# Patient Record
Sex: Male | Born: 1980
Health system: Southern US, Community
[De-identification: ages and names within clinical notes are randomized; demographics above are authoritative.]

## PROBLEM LIST (undated history)

## (undated) DIAGNOSIS — A64 Unspecified sexually transmitted disease: Secondary | ICD-10-CM

## (undated) DIAGNOSIS — F419 Anxiety disorder, unspecified: Secondary | ICD-10-CM

## (undated) DIAGNOSIS — J309 Allergic rhinitis, unspecified: Secondary | ICD-10-CM

## (undated) HISTORY — DX: Anxiety disorder, unspecified: F41.9

## (undated) HISTORY — DX: Unspecified sexually transmitted disease: A64

## (undated) HISTORY — DX: Allergic rhinitis, unspecified: J30.9

## (undated) HISTORY — PX: FOOT SURGERY: SHX648

---
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
## 2004-02-17 ENCOUNTER — Emergency Department (HOSPITAL_COMMUNITY): Admission: EM | Admit: 2004-02-17 | Discharge: 2004-02-17 | Payer: Self-pay | Admitting: Emergency Medicine

## 2004-03-12 ENCOUNTER — Emergency Department (HOSPITAL_COMMUNITY): Admission: EM | Admit: 2004-03-12 | Discharge: 2004-03-13 | Payer: Self-pay | Admitting: Emergency Medicine

## 2006-10-06 ENCOUNTER — Ambulatory Visit: Payer: Self-pay | Admitting: Internal Medicine

## 2006-11-14 ENCOUNTER — Ambulatory Visit: Payer: Self-pay | Admitting: Internal Medicine

## 2006-11-14 LAB — CONVERTED CEMR LAB
Bilirubin Urine: NEGATIVE
Chlamydia, DNA Probe: NEGATIVE
GC Probe Amp, Genital: NEGATIVE
Hemoglobin, Urine: NEGATIVE
Ketones, ur: NEGATIVE mg/dL
Leukocytes, UA: NEGATIVE
Nitrite: NEGATIVE
Protein, ur: NEGATIVE mg/dL
RBC / HPF: NONE SEEN (ref <3)
Specific Gravity, Urine: 1.023 (ref 1.005–1.03)
Urine Glucose: NEGATIVE mg/dL
Urobilinogen, UA: 1 (ref 0.0–1.0)
pH: 6 (ref 5.0–8.0)

## 2007-05-20 ENCOUNTER — Ambulatory Visit: Payer: Self-pay | Admitting: Family Medicine

## 2007-05-20 ENCOUNTER — Encounter: Payer: Self-pay | Admitting: Internal Medicine

## 2007-12-22 ENCOUNTER — Ambulatory Visit: Payer: Self-pay | Admitting: Internal Medicine

## 2007-12-22 DIAGNOSIS — R300 Dysuria: Secondary | ICD-10-CM

## 2007-12-22 LAB — CONVERTED CEMR LAB
Bilirubin Urine: NEGATIVE
Blood in Urine, dipstick: NEGATIVE
Glucose, Urine, Semiquant: NEGATIVE
Ketones, urine, test strip: NEGATIVE
Nitrite: NEGATIVE
Protein, U semiquant: NEGATIVE
Specific Gravity, Urine: 1.01
Urobilinogen, UA: 0.2
WBC Urine, dipstick: NEGATIVE
pH: 5

## 2007-12-25 LAB — CONVERTED CEMR LAB

## 2008-01-16 ENCOUNTER — Ambulatory Visit: Payer: Self-pay | Admitting: Internal Medicine

## 2008-01-16 ENCOUNTER — Telehealth (INDEPENDENT_AMBULATORY_CARE_PROVIDER_SITE_OTHER): Payer: Self-pay | Admitting: *Deleted

## 2008-01-17 ENCOUNTER — Telehealth (INDEPENDENT_AMBULATORY_CARE_PROVIDER_SITE_OTHER): Payer: Self-pay | Admitting: *Deleted

## 2008-01-18 ENCOUNTER — Telehealth (INDEPENDENT_AMBULATORY_CARE_PROVIDER_SITE_OTHER): Payer: Self-pay | Admitting: *Deleted

## 2008-01-18 LAB — CONVERTED CEMR LAB
Chlamydia, DNA Probe: NEGATIVE
Chlamydia, Swab/Urine, PCR: NEGATIVE
GC Probe Amp, Genital: POSITIVE — AB
GC Probe Amp, Urine: POSITIVE — AB

## 2008-02-02 ENCOUNTER — Encounter: Payer: Self-pay | Admitting: Internal Medicine

## 2008-03-01 ENCOUNTER — Telehealth (INDEPENDENT_AMBULATORY_CARE_PROVIDER_SITE_OTHER): Payer: Self-pay | Admitting: *Deleted

## 2008-03-15 ENCOUNTER — Encounter: Payer: Self-pay | Admitting: Internal Medicine

## 2008-04-08 ENCOUNTER — Telehealth (INDEPENDENT_AMBULATORY_CARE_PROVIDER_SITE_OTHER): Payer: Self-pay | Admitting: *Deleted

## 2008-04-09 ENCOUNTER — Ambulatory Visit: Payer: Self-pay | Admitting: Internal Medicine

## 2008-04-09 LAB — CONVERTED CEMR LAB

## 2008-04-15 LAB — CONVERTED CEMR LAB
ALT: 26 units/L (ref 0–53)
AST: 24 units/L (ref 0–37)
BUN: 12 mg/dL (ref 6–23)
Basophils Absolute: 0 10*3/uL (ref 0.0–0.1)
Basophils Relative: 0.2 % (ref 0.0–3.0)
CO2: 29 meq/L (ref 19–32)
Calcium: 9.2 mg/dL (ref 8.4–10.5)
Chloride: 107 meq/L (ref 96–112)
Cholesterol: 185 mg/dL (ref 0–200)
Creatinine, Ser: 1 mg/dL (ref 0.4–1.5)
Eosinophils Absolute: 0.1 10*3/uL (ref 0.0–0.7)
Eosinophils Relative: 3.7 % (ref 0.0–5.0)
GFR calc Af Amer: 114 mL/min
GFR calc non Af Amer: 95 mL/min
Glucose, Bld: 83 mg/dL (ref 70–99)
HCT: 44 % (ref 39.0–52.0)
HDL: 32.8 mg/dL — ABNORMAL LOW (ref >39.0)
Hemoglobin: 14.9 g/dL (ref 13.0–17.0)
LDL Cholesterol: 132 mg/dL — ABNORMAL HIGH (ref 0–99)
Lymphocytes Relative: 50.5 % — ABNORMAL HIGH (ref 12.0–46.0)
MCHC: 33.9 g/dL (ref 30.0–36.0)
MCV: 92.9 fL (ref 78.0–100.0)
Monocytes Absolute: 0.3 10*3/uL (ref 0.1–1.0)
Monocytes Relative: 9 % (ref 3.0–12.0)
Neutro Abs: 1.4 10*3/uL (ref 1.4–7.7)
Neutrophils Relative %: 36.6 % — ABNORMAL LOW (ref 43.0–77.0)
Platelets: 234 10*3/uL (ref 150–400)
Potassium: 4.1 meq/L (ref 3.5–5.1)
RBC: 4.74 M/uL (ref 4.22–5.81)
RDW: 12.4 % (ref 11.5–14.6)
Sodium: 140 meq/L (ref 135–145)
TSH: 0.85 microintl units/mL (ref 0.35–5.50)
Total CHOL/HDL Ratio: 5.6
Triglycerides: 103 mg/dL (ref 0–149)
VLDL: 21 mg/dL (ref 0–40)
WBC: 3.7 10*3/uL — ABNORMAL LOW (ref 4.5–10.5)

## 2008-05-07 ENCOUNTER — Telehealth (INDEPENDENT_AMBULATORY_CARE_PROVIDER_SITE_OTHER): Payer: Self-pay | Admitting: *Deleted

## 2008-09-16 ENCOUNTER — Ambulatory Visit: Payer: Self-pay | Admitting: Family Medicine

## 2008-09-16 DIAGNOSIS — J309 Allergic rhinitis, unspecified: Secondary | ICD-10-CM

## 2008-09-16 HISTORY — DX: Allergic rhinitis, unspecified: J30.9

## 2008-09-16 LAB — CONVERTED CEMR LAB: Rapid Strep: POSITIVE

## 2008-09-23 ENCOUNTER — Encounter (INDEPENDENT_AMBULATORY_CARE_PROVIDER_SITE_OTHER): Payer: Self-pay | Admitting: *Deleted

## 2008-09-23 ENCOUNTER — Ambulatory Visit: Payer: Self-pay | Admitting: Internal Medicine

## 2008-09-26 ENCOUNTER — Telehealth: Payer: Self-pay | Admitting: Internal Medicine

## 2009-03-19 ENCOUNTER — Ambulatory Visit: Payer: Self-pay | Admitting: Family Medicine

## 2009-03-19 LAB — CONVERTED CEMR LAB
Bilirubin Urine: NEGATIVE
Glucose, Urine, Semiquant: NEGATIVE

## 2009-03-20 ENCOUNTER — Encounter: Payer: Self-pay | Admitting: Family Medicine

## 2009-03-20 ENCOUNTER — Telehealth (INDEPENDENT_AMBULATORY_CARE_PROVIDER_SITE_OTHER): Payer: Self-pay | Admitting: *Deleted

## 2009-03-20 LAB — CONVERTED CEMR LAB: GC Probe Amp, Urine: POSITIVE — AB

## 2009-03-31 ENCOUNTER — Telehealth (INDEPENDENT_AMBULATORY_CARE_PROVIDER_SITE_OTHER): Payer: Self-pay | Admitting: *Deleted

## 2009-04-03 ENCOUNTER — Ambulatory Visit: Payer: Self-pay | Admitting: Internal Medicine

## 2009-04-29 ENCOUNTER — Ambulatory Visit: Payer: Self-pay | Admitting: Internal Medicine

## 2009-04-29 ENCOUNTER — Telehealth (INDEPENDENT_AMBULATORY_CARE_PROVIDER_SITE_OTHER): Payer: Self-pay | Admitting: *Deleted

## 2009-04-29 LAB — CONVERTED CEMR LAB
Bilirubin Urine: NEGATIVE
Glucose, Bld: 109 mg/dL
Glucose, Urine, Semiquant: NEGATIVE
Specific Gravity, Urine: 1.01
pH: 6.5

## 2009-04-30 ENCOUNTER — Ambulatory Visit: Payer: Self-pay | Admitting: Internal Medicine

## 2009-04-30 ENCOUNTER — Telehealth (INDEPENDENT_AMBULATORY_CARE_PROVIDER_SITE_OTHER): Payer: Self-pay | Admitting: *Deleted

## 2009-05-06 ENCOUNTER — Encounter: Payer: Self-pay | Admitting: Internal Medicine

## 2009-05-31 ENCOUNTER — Emergency Department (HOSPITAL_COMMUNITY): Admission: EM | Admit: 2009-05-31 | Discharge: 2009-05-31 | Payer: Self-pay | Admitting: Emergency Medicine

## 2009-06-25 ENCOUNTER — Ambulatory Visit: Payer: Self-pay | Admitting: Internal Medicine

## 2009-06-27 LAB — CONVERTED CEMR LAB
ALT: 38 units/L (ref 0–53)
AST: 53 units/L — ABNORMAL HIGH (ref 0–37)
Basophils Relative: 0.2 % (ref 0.0–3.0)
CO2: 29 meq/L (ref 19–32)
Calcium: 9.7 mg/dL (ref 8.4–10.5)
Creatinine, Ser: 1.1 mg/dL (ref 0.4–1.5)
Eosinophils Absolute: 0.1 10*3/uL (ref 0.0–0.7)
HDL: 35.8 mg/dL — ABNORMAL LOW (ref >39.00)
Lymphocytes Relative: 51.2 % — ABNORMAL HIGH (ref 12.0–46.0)
MCHC: 34.3 g/dL (ref 30.0–36.0)
Neutrophils Relative %: 35.2 % — ABNORMAL LOW (ref 43.0–77.0)
RBC: 4.82 M/uL (ref 4.22–5.81)
Total CHOL/HDL Ratio: 5
Triglycerides: 59 mg/dL (ref 0.0–149.0)
WBC: 3.4 10*3/uL — ABNORMAL LOW (ref 4.5–10.5)

## 2009-07-04 IMAGING — CR DG CHEST 2V
2 series · 2 of 2 positions shown · non-contrast
Comparison: None

CLINICAL DATA: Fever.  Chest congestion.  Shortness of breath.

CHEST - 2 VIEW

[view not recorded (1 of 2)]
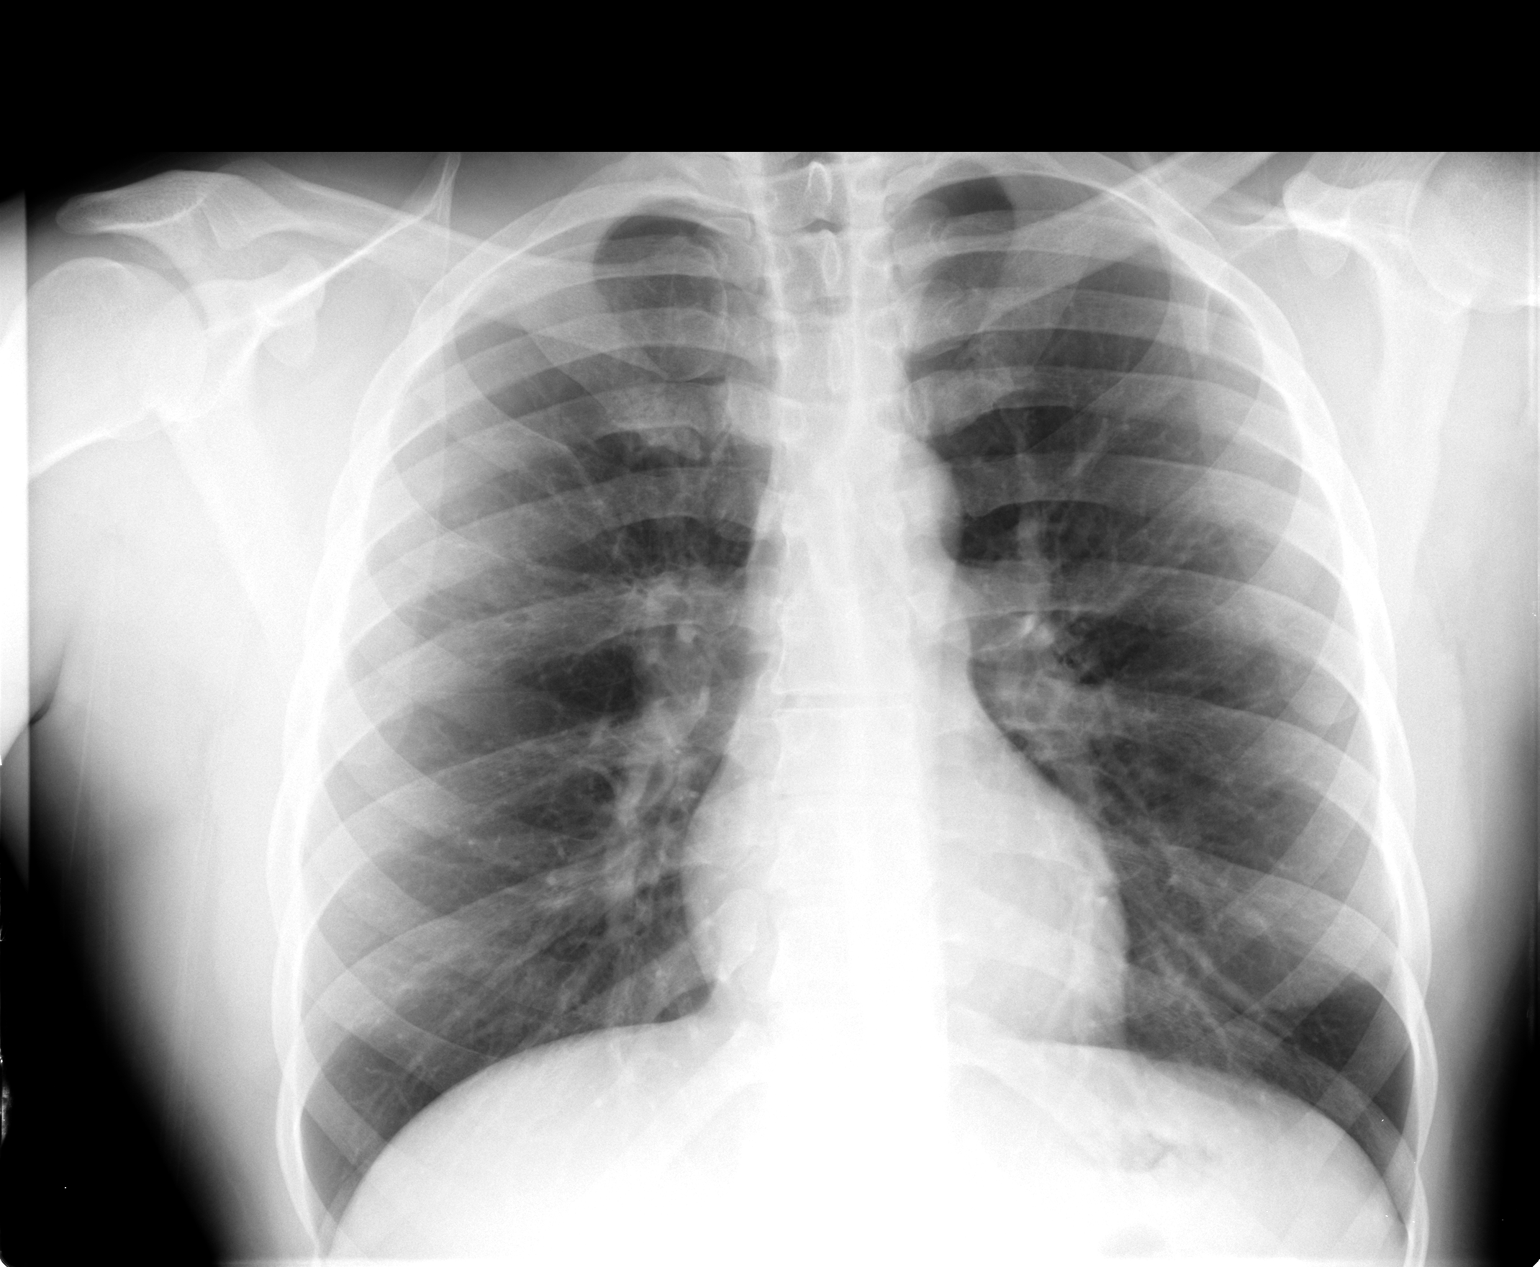

[view not recorded (2 of 2)]
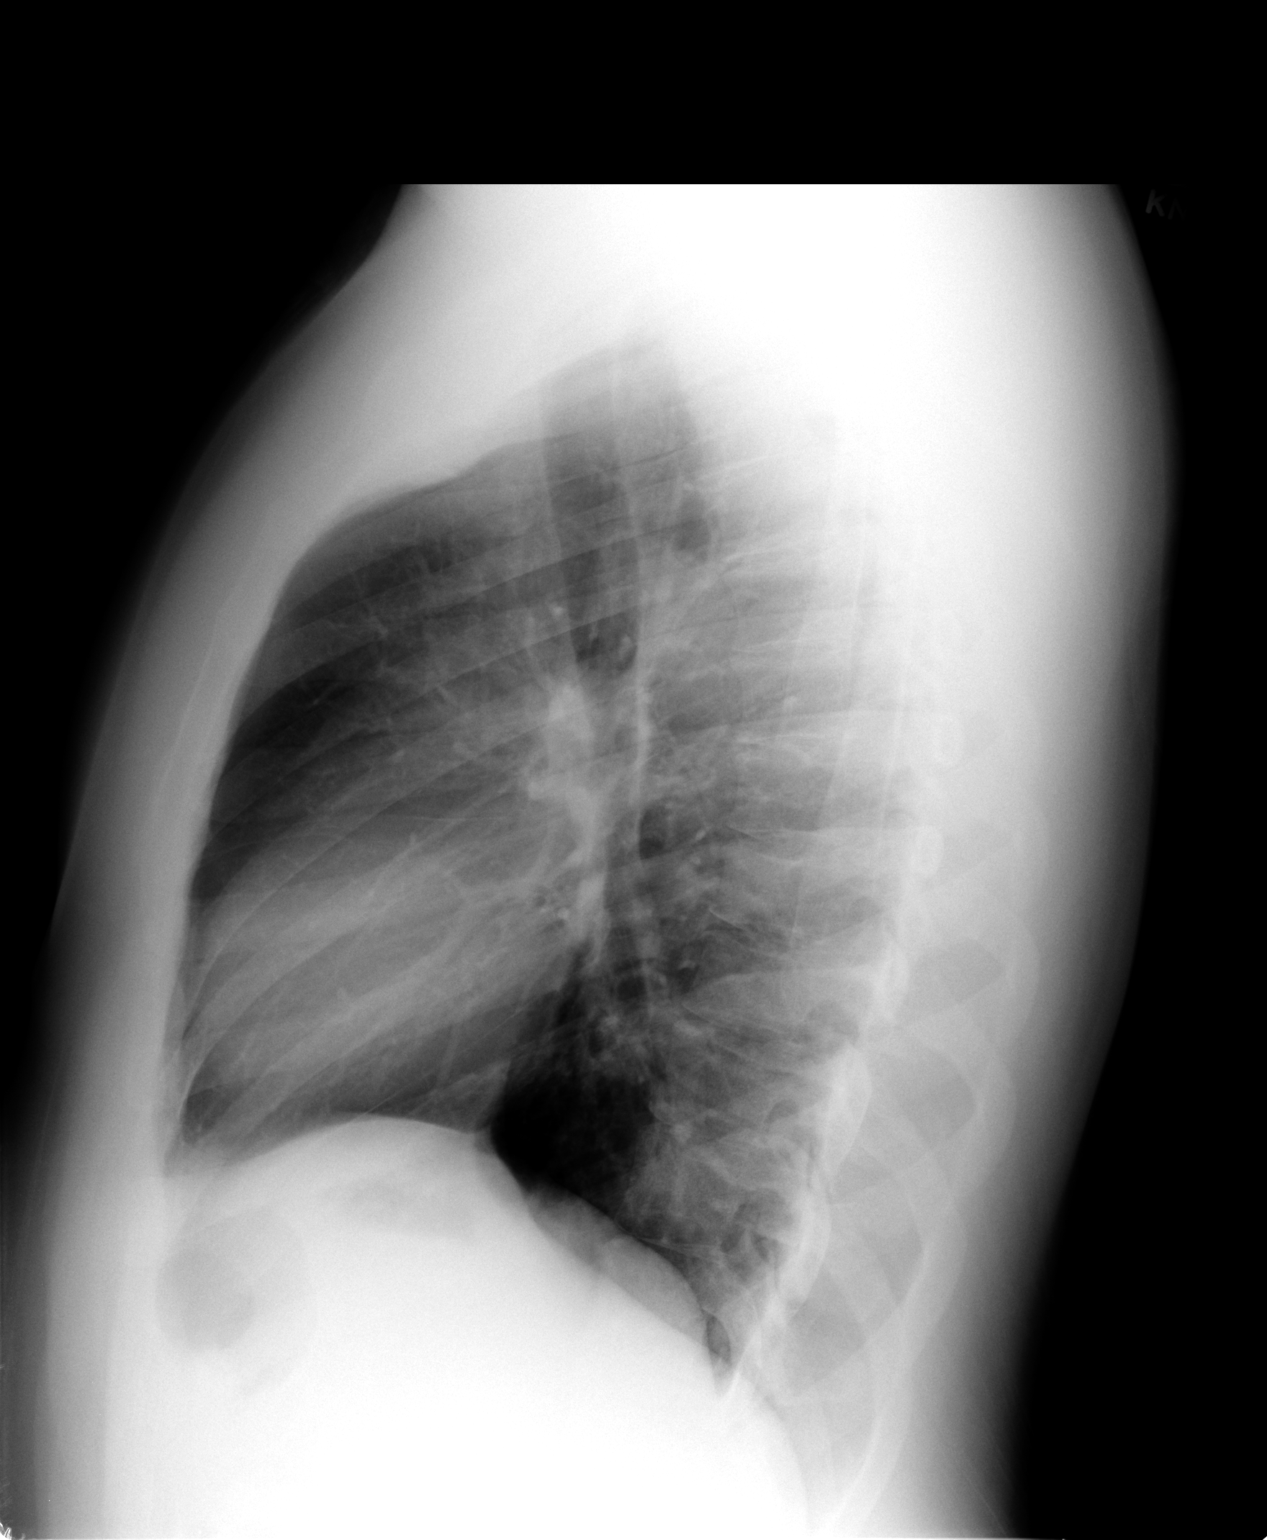

[2 of 2 positions shown; findings below may reference images not displayed]

FINDINGS: The heart size and mediastinal contours are within normal
limits.  Both lungs are clear.  The visualized skeletal structures
are unremarkable.
IMPRESSION: No active cardiopulmonary disease.

## 2009-07-24 ENCOUNTER — Telehealth: Payer: Self-pay | Admitting: Internal Medicine

## 2009-09-23 ENCOUNTER — Ambulatory Visit: Payer: Self-pay | Admitting: Internal Medicine

## 2009-09-23 DIAGNOSIS — R740 Nonspecific elevation of levels of transaminase and lactic acid dehydrogenase [LDH]: Secondary | ICD-10-CM

## 2009-09-23 DIAGNOSIS — R7402 Elevation of levels of lactic acid dehydrogenase (LDH): Secondary | ICD-10-CM | POA: Insufficient documentation

## 2009-09-23 DIAGNOSIS — L84 Corns and callosities: Secondary | ICD-10-CM

## 2009-09-23 LAB — CONVERTED CEMR LAB
ALT: 21 units/L (ref 0–53)
AST: 22 units/L (ref 0–37)
Albumin: 4.2 g/dL (ref 3.5–5.2)
Alkaline Phosphatase: 74 units/L (ref 39–117)
Total Protein: 7.4 g/dL (ref 6.0–8.3)

## 2010-02-08 IMAGING — CR DG CHEST 2V
2 series · 2 of 2 positions shown · non-contrast
Comparison: 09/23/2008

CLINICAL DATA: Cough, shortness of breath.

CHEST - 2 VIEW

[view not recorded (1 of 2)]
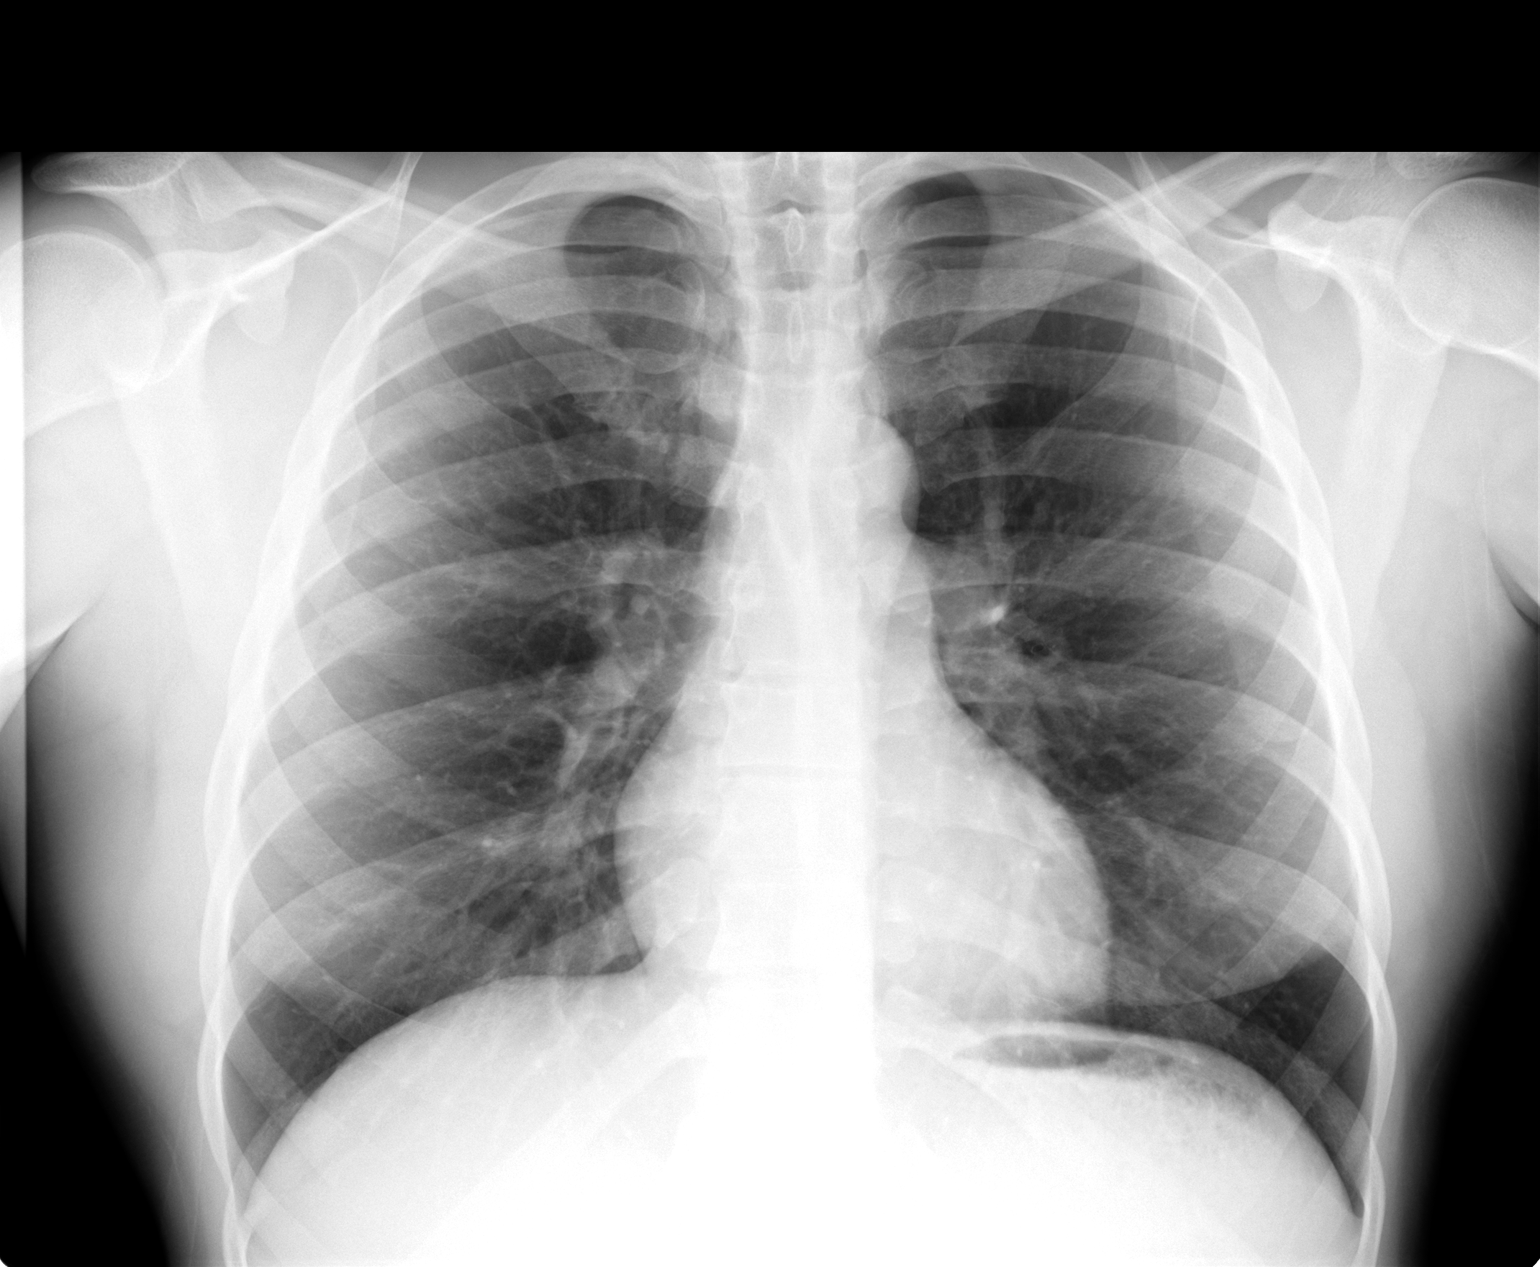

[view not recorded (2 of 2)]
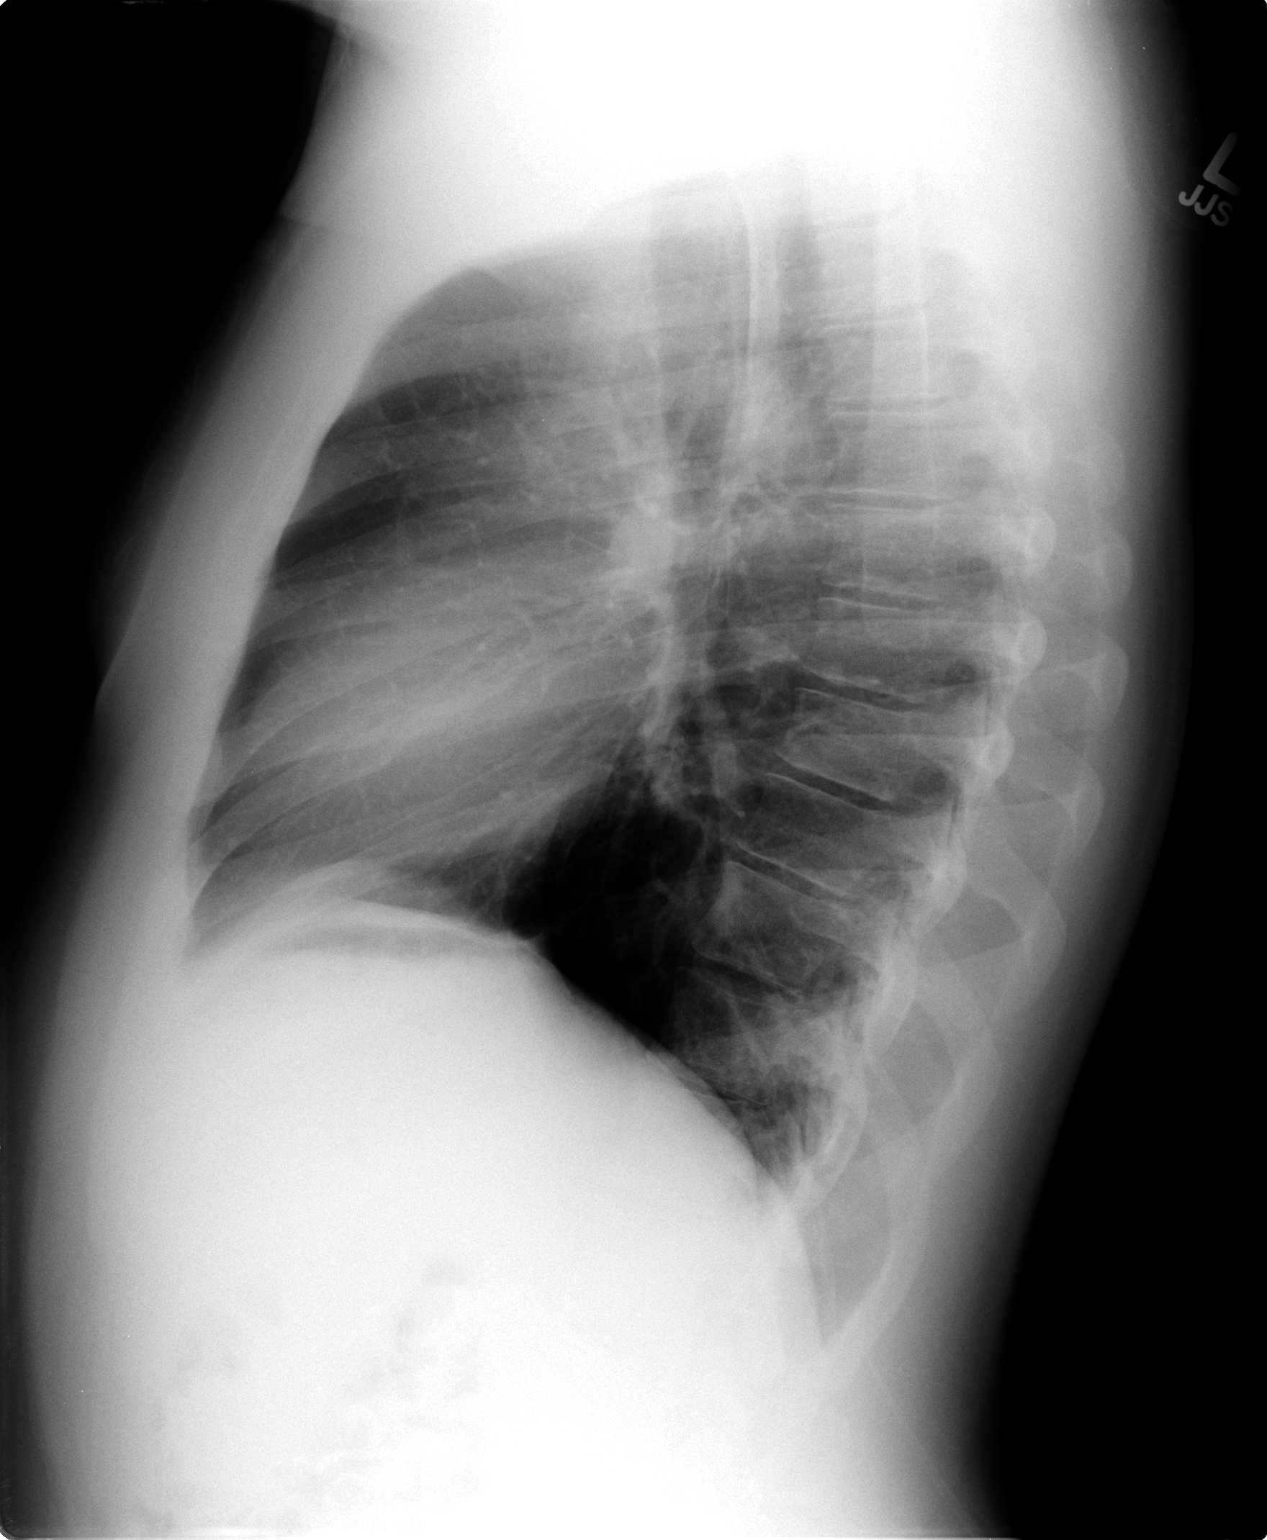

[2 of 2 positions shown; findings below may reference images not displayed]

FINDINGS: Trachea is midline.  Heart size normal.  Lungs are clear.
No pleural fluid.
IMPRESSION: No acute findings.

## 2010-03-20 ENCOUNTER — Ambulatory Visit: Payer: Self-pay | Admitting: Internal Medicine

## 2010-03-20 LAB — CONVERTED CEMR LAB
Nitrite: NEGATIVE
Specific Gravity, Urine: 1.02
WBC Urine, dipstick: NEGATIVE

## 2010-04-02 ENCOUNTER — Ambulatory Visit: Payer: Self-pay | Admitting: Internal Medicine

## 2010-04-02 LAB — CONVERTED CEMR LAB
Blood in Urine, dipstick: NEGATIVE
Nitrite: NEGATIVE
Protein, U semiquant: NEGATIVE
WBC Urine, dipstick: NEGATIVE

## 2010-04-03 ENCOUNTER — Encounter: Payer: Self-pay | Admitting: Internal Medicine

## 2010-04-06 LAB — CONVERTED CEMR LAB: GC Probe Amp, Urine: NEGATIVE

## 2010-04-07 ENCOUNTER — Encounter: Payer: Self-pay | Admitting: Internal Medicine

## 2010-06-22 ENCOUNTER — Telehealth (INDEPENDENT_AMBULATORY_CARE_PROVIDER_SITE_OTHER): Payer: Self-pay | Admitting: *Deleted

## 2010-06-26 ENCOUNTER — Ambulatory Visit: Payer: Self-pay | Admitting: Internal Medicine

## 2010-06-26 ENCOUNTER — Encounter: Payer: Self-pay | Admitting: Internal Medicine

## 2010-06-26 LAB — CONVERTED CEMR LAB
Bilirubin Urine: NEGATIVE
Blood in Urine, dipstick: NEGATIVE
GC Probe Amp, Genital: NEGATIVE
HIV: NONREACTIVE
Ketones, urine, test strip: NEGATIVE
Protein, U semiquant: NEGATIVE
Urobilinogen, UA: 0.2
pH: 7

## 2010-06-30 LAB — CONVERTED CEMR LAB
ALT: 34 units/L (ref 0–53)
BUN: 15 mg/dL (ref 6–23)
Basophils Absolute: 0 10*3/uL (ref 0.0–0.1)
Bilirubin, Direct: 0.2 mg/dL (ref 0.0–0.3)
Cholesterol: 196 mg/dL (ref 0–200)
Creatinine, Ser: 1.2 mg/dL (ref 0.4–1.5)
Eosinophils Relative: 0.3 % (ref 0.0–5.0)
GFR calc non Af Amer: 94.8 mL/min (ref >60.00)
Glucose, Bld: 76 mg/dL (ref 70–99)
HDL: 49.4 mg/dL (ref >39.00)
LDL Cholesterol: 138 mg/dL — ABNORMAL HIGH (ref 0–99)
Lymphs Abs: 1 10*3/uL (ref 0.7–4.0)
Monocytes Absolute: 0.5 10*3/uL (ref 0.1–1.0)
Monocytes Relative: 8.7 % (ref 3.0–12.0)
Neutrophils Relative %: 75.3 % (ref 43.0–77.0)
Platelets: 231 10*3/uL (ref 150.0–400.0)
RDW: 12.6 % (ref 11.5–14.6)
Total Bilirubin: 1.2 mg/dL (ref 0.3–1.2)
Triglycerides: 42 mg/dL (ref 0.0–149.0)
VLDL: 8.4 mg/dL (ref 0.0–40.0)
WBC: 6.3 10*3/uL (ref 4.5–10.5)

## 2010-08-18 NOTE — Letter (Signed)
Summary: Communicable Disease Report/[facility]  Communicable Disease Report/[facility]   Imported By: Lanelle Bal 04/15/2010 09:10:44  _____________________________________________________________________  External Attachment:    Type:   Image     Comment:   External Document

## 2010-08-18 NOTE — Assessment & Plan Note (Signed)
Summary: irritation when urinating/pt will discuss other issue/cbs   Vital Signs:  Patient profile:   30 year old male Weight:      176.25 pounds Pulse rate:   73 / minute Pulse rhythm:   regular BP sitting:   122 / 78  (left arm) Cuff size:   large  Vitals Entered By: Army Fossa CMA (April 02, 2010 10:21 AM) CC: c/o slight irritation when urinating.  Comments - Pt states someone he has sex with had a "bacterial" infection.  - No discharge - Discuss flu shot - [facility].    History of Present Illness: on and off dysuria for a few days His previous girlfriend was told she had a bacterial infection in the cervix, she received antibiotics, her doctor recommended her sexual partners to be checked.  ROS Denies any genital rash No pedal discharge No difficulty urinating No gross hematuria He has been practicing safe sex.  Current Medications (verified): 1)  Cialis 20 Mg  Tabs (Tadalafil) .... 1/2 To 1 By Mouth Once Daily As Needed 2)  Nasonex 50 Mcg/act Susp (Mometasone Furoate) .... Two Sprays On Each Side of The Nose Once Daily  Allergies (verified): No Known Drug Allergies  Past History:  Past Medical History: Reviewed history from 03/19/2009 and no changes required. allergies gonorrhea (more than once)  Past Surgical History: Reviewed history from 04/29/2009 and no changes required. R foot surgery as a child?  Social History: Reviewed history from 03/20/2010 and no changes required. original from [place] city moved to [place] aprox 2006 Single no children  tobacco-- cigars on-off  ETOH--  rarely  exercise--  very active, goes to the gym x 3/week Diet--does watch   Physical Exam  General:  alert, well-developed, and well-nourished.   Abdomen:  no inguinal lymphadenopathies Genitalia:  circumcised, no hydrocele, no varicocele, no scrotal masses, no testicular masses or atrophy, no cutaneous lesions, and no urethral discharge.      Impression & Recommendations:  Problem # 1:  DYSURIA (ICD-788.1)  mild dysuria with negative clinical exam and normal udip Recommend check a G&C  then   observation  Orders: UA Dipstick w/o Micro (automated)  (81003) T-GC Probe, urine (16109-60454) T-Chlamydia  Probe, urine [phone])  Orders: UA Dipstick w/o Micro (automated)  (81003) T-GC Probe, urine (29562-13086) T-Chlamydia  Probe, urine (57846-96295)  Problem # 2:  flu shot provided  Complete Medication List: 1)  Cialis 20 Mg Tabs (Tadalafil) .... 1/2 to 1 by mouth once daily as needed 2)  Nasonex 50 Mcg/act Susp (Mometasone furoate) .... Two sprays on each side of the nose once daily  Other Orders: Admin 1st Vaccine (28413) Flu Vaccine 72yrs + [phone])  Laboratory Results   Urine Tests    Routine Urinalysis   Color: yellow Appearance: Clear Glucose: negative   (Normal Range: Negative) Bilirubin: negative   (Normal Range: Negative) Ketone: negative   (Normal Range: Negative) Spec. Gravity: 1.020   (Normal Range: 1.003-1.035) Blood: negative   (Normal Range: Negative) pH: 6.5   (Normal Range: 5.0-8.0) Protein: negative   (Normal Range: Negative) Urobilinogen: 0.2   (Normal Range: 0-1) Nitrite: negative   (Normal Range: Negative) Leukocyte Esterace: negative   (Normal Range: Negative)    Comments: Army Fossa CMA  April 02, 2010 10:29 AM    Flu Vaccine Consent Questions     Do you have a history of severe allergic reactions to this vaccine? no    Any prior history of allergic reactions to egg  and/or gelatin? no    Do you have a sensitivity to the preservative Thimersol? no    Do you have a past history of Guillan-Barre Syndrome? no    Do you currently have an acute febrile illness? no    Have you ever had a severe reaction to latex? no    Vaccine information given and explained to patient? yes    Are you currently pregnant? no    Lot Number:[id number]   Exp Date:01/16/2011   Site Given   Right Deltoid IM    Comments: Army Fossa CMA  April 02, 2010 10:29 AM    .lbflu

## 2010-08-18 NOTE — Progress Notes (Signed)
Summary: chantix rx  Phone Note Call from Patient Call back at Home Phone [phone]   Summary of Call: Patient left message on VM requesting rx for Chantix. [facility] Initial call taken by: Shary Decamp,  July 24, 2009 1:51 PM  Follow-up for Phone Call        call in : --starting package for chantix   to be used the first month -- then use Cahntix 1mg  by mouth two times a day for the second and third months. --Quit tobacco 15 days after he initiate chantix --d/c chantix and  call if severe s/e  Follow-up by: Malissia Rabbani E. Alem Fahl MD,  July 24, 2009 2:16 PM  Additional Follow-up for Phone Call Additional follow up Details #1::        discussed with pt Shary Decamp  July 24, 2009 2:23 PM     New/Updated Medications: CHANTIX STARTING MONTH PAK 0.5 MG X 11 & 1 MG X 42 TABS (VARENICLINE TARTRATE) as directed CHANTIX CONTINUING MONTH PAK 1 MG TABS (VARENICLINE TARTRATE) as directed Prescriptions: CHANTIX CONTINUING MONTH PAK 1 MG TABS (VARENICLINE TARTRATE) as directed  #1 x 1   Entered by:   Shary Decamp   Authorized by:   Nolon Rod. Jaleeah Slight MD   Signed by:   Shary Decamp on 07/24/2009   Method used:   Electronically to        [facility] # [id number]* (retail)       [address]       [place], [place]  [address]       Ph: [phone]       Fax: [phone]   RxID:   [id number] CHANTIX STARTING MONTH PAK 0.5 MG X 11 & 1 MG X 42 TABS (VARENICLINE TARTRATE) as directed  #1 x 0   Entered by:   Shary Decamp   Authorized by:   Nolon Rod. Gudrun Axe MD   Signed by:   Shary Decamp on 07/24/2009   Method used:   Electronically to        [facility] # [id number]* (retail)       [address]       [place], [place]  [address]       Ph: [phone]       Fax: [phone]   RxID:   [id number]

## 2010-08-18 NOTE — Assessment & Plan Note (Signed)
Summary: discuss having labs/cbs-PT NOT DUE FOR CPX UNTIL [id number]   Vital Signs:  Patient profile:   30 year old male Weight:      174.38 pounds Pulse rate:   78 / minute Pulse rhythm:   regular BP sitting:   122 / 76  (left arm) Cuff size:   large  Vitals Entered By: Army Fossa CMA (March 20, 2010 11:05 AM) CC: Pt here to be tested for HIV/STDS- not having any symptoms.    History of Present Illness: likes a STD check doing well   Current Medications (verified): 1)  Cialis 20 Mg  Tabs (Tadalafil) .... 1/2 To 1 By Mouth Once Daily As Needed 2)  Chantix Starting Month Pak 0.5 Mg X 11 & 1 Mg X 42 Tabs (Varenicline Tartrate) .... As Directed 3)  Chantix Continuing Month Pak 1 Mg Tabs (Varenicline Tartrate) .... As Directed 4)  Nasonex 50 Mcg/act Susp (Mometasone Furoate) .... Two Sprays On Each Side of The Nose Once Daily  Allergies (verified): No Known Drug Allergies  Past History:  Past Medical History: Reviewed history from 03/19/2009 and no changes required. allergies gonorrhea (more than once)  Past Surgical History: Reviewed history from 04/29/2009 and no changes required. R foot surgery as a child?  Social History: original from [place] moved to [place] aprox 2006 Single no children  tobacco-- cigars on-off  ETOH--  rarely  exercise--  very active, goes to the gym x 3/week Diet--does watch   Review of Systems GU:  good results w/ cialis states has a more consistent use of condoms . Endo:  some allergies, nose congestion nasonex $$, using it as needed .  Physical Exam  General:  alert and well-developed.   Head:  face is symmetric, not tender to palpation in the maxillary areas Ears:  R ear normal and L ear normal.   Nose:  mod congested Mouth:  no redness or discharge Lungs:  normal respiratory effort, no intercostal retractions, no accessory muscle use, and normal breath sounds.     Impression & Recommendations:  Problem # 1:  SCREENING  EXAMINATION FOR VENEREAL DISEASE (ICD-V74.5) safe sex encouraged again ! Orders: Venipuncture (16109) T-HIV Antibody  (Reflex) [id number]) T-RPR (Syphilis) (91478-29562) UA Dipstick w/o Micro (automated)  (81003) Specimen Handling (13086)  Problem # 2:  RHINITIS (ICD-477.9)  nasonex $$ samples and a discount card provided  His updated medication list for this problem includes:    Nasonex 50 Mcg/act Susp (Mometasone furoate) .Marland Kitchen..Marland Kitchen Two sprays on each side of the nose once daily  His updated medication list for this problem includes:    Nasonex 50 Mcg/act Susp (Mometasone furoate) .Marland Kitchen..Marland Kitchen Two sprays on each side of the nose once daily  Complete Medication List: 1)  Cialis 20 Mg Tabs (Tadalafil) .... 1/2 to 1 by mouth once daily as needed 2)  Nasonex 50 Mcg/act Susp (Mometasone furoate) .... Two sprays on each side of the nose once daily  Patient Instructions: 1)  came back in 2 or 3 months for your physical, fasting Prescriptions: NASONEX 50 MCG/ACT SUSP (MOMETASONE FUROATE) two sprays on each side of the nose once daily  #1 x 12   Entered and Authorized by:   Jose E. Paz MD   Signed by:   Nolon Rod. Paz MD on 03/20/2010   Method used:   Print then Give to Patient   RxID:   [id number]   Laboratory Results   Urine Tests    Routine Urinalysis  Color: yellow Appearance: Clear Glucose: negative   (Normal Range: Negative) Bilirubin: negative   (Normal Range: Negative) Ketone: negative   (Normal Range: Negative) Spec. Gravity: 1.020   (Normal Range: 1.003-1.035) Blood: negative   (Normal Range: Negative) pH: 6.5   (Normal Range: 5.0-8.0) Protein: negative   (Normal Range: Negative) Urobilinogen: 0.2   (Normal Range: 0-1) Nitrite: negative   (Normal Range: Negative) Leukocyte Esterace: negative   (Normal Range: Negative)    Comments: Army Fossa CMA  March 20, 2010 11:55 AM

## 2010-08-18 NOTE — Assessment & Plan Note (Signed)
Summary: 3 MONTH FOLLOWUP///SPH   Vital Signs:  Patient profile:   30 year old male Height:      73.5 inches Weight:      181 pounds BMI:     23.64 Pulse rate:   88 / minute BP sitting:   100 / 70  Vitals Entered By: Shary Decamp (September 23, 2009 9:11 AM) CC: rov - fasting, c/o allergy sxs   History of Present Illness: LFTs were slightly elevated the last time he was here in December 2010, at that time he was taking over-the-counter amino acids.  He was not taking acetaminophen, he drinks rarely  Current Medications (verified): 1)  Cialis 20 Mg  Tabs (Tadalafil) .... 1/2 To 1 By Mouth Once Daily As Needed 2)  Chantix Starting Month Pak 0.5 Mg X 11 & 1 Mg X 42 Tabs (Varenicline Tartrate) .... As Directed 3)  Chantix Continuing Month Pak 1 Mg Tabs (Varenicline Tartrate) .... As Directed  Allergies (verified): No Known Drug Allergies  Past History:  Past Medical History: Reviewed history from 03/19/2009 and no changes required. allergies gonorrhea (more than once)  Past Surgical History: Reviewed history from 04/29/2009 and no changes required. R foot surgery as a child?  Social History: Reviewed history from 06/25/2009 and no changes required. original from [place] moved to [place] aprox 2006 Single no children  tobacco-- cigars on-off  ETOH-- socially  exercise--  very active, goes to the gym x 3/week Diet--does watch   Review of Systems       complain of allergies,  mostly eye and nose itching and congestion current  not  taking Claritin and Nasonex he continued to have problems with callouses in his feet, status-post podiatrist evaluation, the calluses were trim   Physical Exam  General:  alert, well-developed, and well-nourished.   Extremities:  several large calluses at the right great toe, plantar area   Impression & Recommendations:  Problem # 1:  TRANSAMINASES, SERUM, ELEVATED (ICD-790.4) labs  Orders: Venipuncture (04540) TLB-Hepatic/Liver Function  Pnl (80076-HEPATIC)  Problem # 2:  RHINITIS (ICD-477.9) need to restart Nasonex and Claritin  His updated medication list for this problem includes:    Nasonex 50 Mcg/act Susp (Mometasone furoate) .Marland Kitchen..Marland Kitchen Two sprays on each side of the nose once daily  Problem # 3:  CALLUS, FOOT (ICD-700) large calluses in the right foot, small one on the left nothing indicates other problems plan: Revisit his podiatrist routinely, consider dermatology eval  Complete Medication List: 1)  Cialis 20 Mg Tabs (Tadalafil) .... 1/2 to 1 by mouth once daily as needed 2)  Chantix Starting Month Pak 0.5 Mg X 11 & 1 Mg X 42 Tabs (Varenicline tartrate) .... As directed 3)  Chantix Continuing Month Pak 1 Mg Tabs (Varenicline tartrate) .... As directed 4)  Nasonex 50 Mcg/act Susp (Mometasone furoate) .... Two sprays on each side of the nose once daily  Patient Instructions: 1)  Claritin 10 mg over-the-counter daily for allergies 2)  Nasonex as prescribed 3)  call if the allergies are not better Prescriptions: NASONEX 50 MCG/ACT SUSP (MOMETASONE FUROATE) two sprays on each side of the nose once daily  #1 x 12   Entered and Authorized by:   Makesha Belitz E. Keola Heninger MD   Signed by:   Nolon Rod. Jaynia Fendley MD on 09/23/2009   Method used:   Print then Give to Patient   RxID:   [id number]

## 2010-08-18 NOTE — Progress Notes (Signed)
Summary: needs [facility] for Cialis  Phone Note Refill Request Call back at [facility] Phone [phone] Message from:  Patient on June 22, 2010 10:32 AM  Refills Requested: Medication #1:  CIALIS 20 MG  TABS 1/2 TO 1 by mouth once daily as needed patient called to check on cialis prescriptiion---needs it to go to [facility] on [address], NOT [facility] on [address]---please check location and call patient to tell him that it has been called in to the [place] location  Initial call taken by: Jerolyn Shin,  June 22, 2010 10:33 AM Caller: Patient  Follow-up for Phone Call        I spoke with pt he is aware meds sent in.  Follow-up by: Army Fossa CMA,  June 22, 2010 10:37 AM    Prescriptions: CIALIS 20 MG  TABS (TADALAFIL) 1/2 TO 1 by mouth once daily as needed  #6 x 3   Entered by:   Army Fossa CMA   Authorized by:   Nolon Rod. Paz MD   Signed by:   Army Fossa CMA on 06/22/2010   Method used:   Electronically to        [facility] #[id number]* (retail)       [address]       [place], [place]  [address]       Ph: [phone]       Fax: [phone]   RxID:   [id number]

## 2010-08-20 NOTE — Assessment & Plan Note (Signed)
Summary: cpx & lab/cbs   Vital Signs:  Patient profile:   30 year old male Height:      73.5 inches Weight:      176.50 pounds BMI:     23.05 Pulse rate:   101 / minute Pulse rhythm:   regular BP sitting:   126 / 76  (left arm) Cuff size:   large  Vitals Entered By: Army Fossa CMA (June 26, 2010 9:12 AM) CC: CPX, fasting  Comments c/o diarrhea x 2 days discomfort in lower abdomen, feels tingling when urinating [facility]   History of Present Illness: CPX see above  Preventive Screening-Counseling & Management  Alcohol-Tobacco     Smoking Status: quit     Year Quit: 2010  Current Medications (verified): 1)  Cialis 20 Mg  Tabs (Tadalafil) .... 1/2 To 1 By Mouth Once Daily As Needed 2)  Nasonex 50 Mcg/act Susp (Mometasone Furoate) .... Two Sprays On Each Side of The Nose Once Daily  Allergies (verified): No Known Drug Allergies  Past History:  Past Medical History: Reviewed history from 03/19/2009 and no changes required. allergies gonorrhea (more than once)  Past Surgical History: Reviewed history from 04/29/2009 and no changes required. R foot surgery as a child?  Family History: Reviewed history from 12/22/2007 and no changes required. hyperthyroid - M CAD - M. Uncle HTN - M family DM - M Family stroke - MGM, MGF prostate Ca - no colon Ca - M. great uncle, M neice  Social History: original from [place] moved to [place] aprox 2006 Single no children  tobacco-- denies  ETOH--  rarely  exercise--  very active, goes to the gym x 3/week Diet--does watch  Smoking Status:  quit  Review of Systems General:  Denies fatigue and weight loss. CV:  Denies chest pain or discomfort, palpitations, and swelling of feet. Resp:  Denies cough and wheezing. GI:  had a "stomach virus" the last  36 hours. Nausea, vomiting, diarrhea. Some abdominal discomfort. This morning he feels much better.. GU:  ill defined discomfort in the groins for while. Some   urethral discomfort?  No discharge , no actual dysuria or. Psych:  Denies depression; anxious at times .  Physical Exam  General:  alert, well-developed, and well-nourished.   Neck:  no masses and no thyromegaly.   Lungs:  normal respiratory effort, no intercostal retractions, no accessory muscle use, and normal breath sounds.   Heart:  normal rate, regular rhythm, no murmur, and no gallop.   Abdomen:  soft, non-tender, no distention, no masses, no guarding, and no rigidity.   Genitalia:  circumcised, no hydrocele, no varicocele, no scrotal masses, no testicular masses or atrophy, no cutaneous lesions. a very small amount of clear discharge noted Extremities:  no edema   Impression & Recommendations:  Problem # 1:  HEALTH SCREENING (ICD-V70.0) Td 2009 flu shot  discussed   STE discussed, printed material provided safe  sex discussed Labs healthy diet and exercise discussed  Orders: Venipuncture (16109) TLB-Lipid Panel (80061-LIPID) TLB-BMP (Basic Metabolic Panel-BMET) (80048-METABOL) TLB-CBC Platelet - w/Differential (85025-CBCD) TLB-Hepatic/Liver Function Pnl (80076-HEPATIC) T-HIV Antibody  (Reflex) ([id number]) T-RPR (Syphilis) (91478-29562) Specimen Handling (13086)  Problem # 2:  ? of URETHRITIS, ACUTE (ICD-597.80) Assessment: New  symptoms suggestive of urethritis although Udip is neg  G&C sent He has practiced unsafe sex, again I discussed with him the risks include HIV Labs  Orders: T-GC Probe, genital (57846-96295) T-HIV Antibody  (Reflex) [id number]) T-RPR (Syphilis) (02725-36644) Specimen  Handling (16109)  Problem # 3:  other issues reports a stomach virus yesterday, see review of systems Pulse slightly elevated likely related to recent acute illness  Complete Medication List: 1)  Cialis 20 Mg Tabs (Tadalafil) .... 1/2 to 1 by mouth once daily as needed 2)  Nasonex 50 Mcg/act Susp (Mometasone furoate) .... Two sprays on each side of the nose once  daily  Other Orders: UA Dipstick w/o Micro (automated)  (81003)  Patient Instructions: 1)  drink plenty of fluids and call if her stomach is not  better in the next 2 days 2)  Please schedule a follow-up appointment in 1 year.    Orders Added: 1)  UA Dipstick w/o Micro (automated)  [81003] 2)  T-GC Probe, genital [60454-09811] 3)  Venipuncture [36415] 4)  TLB-Lipid Panel [80061-LIPID] 5)  TLB-BMP (Basic Metabolic Panel-BMET) [80048-METABOL] 6)  TLB-CBC Platelet - w/Differential [85025-CBCD] 7)  TLB-Hepatic/Liver Function Pnl [80076-HEPATIC] 8)  T-HIV Antibody  (Reflex) [[id number]] 9)  T-RPR (Syphilis) [13086-57846] 10)  Specimen Handling [99000] 11)  Est. Patient age 59-39 [31]     Risk Factors:  Tobacco use:  quit    Year quit:  2010   Laboratory Results   Urine Tests    Routine Urinalysis   Color: orange Appearance: Clear Glucose: negative   (Normal Range: Negative) Bilirubin: negative   (Normal Range: Negative) Ketone: negative   (Normal Range: Negative) Spec. Gravity: 1.020   (Normal Range: 1.003-1.035) Blood: negative   (Normal Range: Negative) pH: 7.0   (Normal Range: 5.0-8.0) Protein: negative   (Normal Range: Negative) Urobilinogen: 0.2   (Normal Range: 0-1) Nitrite: negative   (Normal Range: Negative) Leukocyte Esterace: negative   (Normal Range: Negative)    Comments: Army Fossa CMA  June 26, 2010 9:16 AM

## 2010-10-26 ENCOUNTER — Telehealth: Payer: Self-pay | Admitting: Internal Medicine

## 2010-10-26 MED ORDER — MOMETASONE FUROATE 50 MCG/ACT NA SUSP: 2.0000 | Freq: Every day | NASAL | Status: DC

## 2010-10-26 NOTE — Telephone Encounter (Signed)
Patient wants refill nasonex - [facility]

## 2010-11-07 ENCOUNTER — Ambulatory Visit: Payer: Self-pay | Admitting: Family Medicine

## 2010-11-07 ENCOUNTER — Ambulatory Visit (INDEPENDENT_AMBULATORY_CARE_PROVIDER_SITE_OTHER): Payer: BC Managed Care – PPO | Admitting: Family Medicine

## 2010-11-07 VITALS — BP 108/74 | HR 81 | Temp 98.0°F | Wt 205.0 lb

## 2010-11-07 DIAGNOSIS — H6592 Unspecified nonsuppurative otitis media, left ear: Secondary | ICD-10-CM

## 2010-11-07 DIAGNOSIS — H6590 Unspecified nonsuppurative otitis media, unspecified ear: Secondary | ICD-10-CM

## 2010-11-07 DIAGNOSIS — J029 Acute pharyngitis, unspecified: Secondary | ICD-10-CM

## 2010-11-07 DIAGNOSIS — J301 Allergic rhinitis due to pollen: Secondary | ICD-10-CM

## 2010-11-07 MED ORDER — AMOXICILLIN-POT CLAVULANATE 875-125 MG PO TABS: 1.0000 | ORAL_TABLET | Freq: Two times a day (BID) | ORAL | Status: AC

## 2010-11-07 NOTE — Progress Notes (Signed)
30 year old male:  2 weeks, every spring, gets some bad allergies. Had some sore throat, stuffy nose. Some congestion. Has a history of sinus infections. Took some amox (left over from last year), nasonex, a week ST got better. Continued sinus congestion.  Swollen on the L side and ear is hurting him a lot. - 1 week.   Patient Active Problem List  Diagnoses  . RHINITIS  . CALLUS, FOOT  . DYSURIA  . TRANSAMINASES, SERUM, ELEVATED   Past Medical History  Diagnosis Date  . RHINITIS 09/16/2008   Past Surgical History  Procedure Date  . Foot surgery     Right foot surgery as a child?   History  Substance Use Topics  . Smoking status: Not on file  . Smokeless tobacco: Not on file  . Alcohol Use:    Family History  Problem Relation Age of Onset  . Thyroid disease Mother     hyperthyroid  . Heart disease Maternal Uncle   . Stroke Maternal Grandmother   . Stroke Maternal Grandfather   . Diabetes Other   . Hypertension Other   . Cancer Other     Colon Cancer-M Marisue [place], M Niece   No Known Allergies Current Outpatient Prescriptions on File Prior to Visit  Medication Sig Dispense Refill  . mometasone (NASONEX) 50 MCG/ACT nasal spray 2 sprays by Nasal route daily.  17 g  2   ROS: GEN: Acute illness details above GI: Tolerating PO intake GU: maintaining adequate hydration and urination Pulm: No SOB Interactive and getting along well at home.  Otherwise, ROS is as per the HPI.  Gen: WDWN, NAD; A & O x3, cooperative. Pleasant.Globally Non-toxic HEENT: Normocephalic and atraumatic. Throat clear, w/o exudate, R TM clear, serous fluid, L TM - indistinct LM, bulging TM, + fluid present. rhinnorhea.  MMM Frontal sinuses: NT Max sinuses: NT NECK: Anterior cervical  LAD is present on L CV: RRR, No M/G/R, cap refill <2 sec PULM: Breathing comfortably in no respiratory distress. no wheezing, crackles, rhonchi EXT: No c/c/e PSYCH: Friendly, good eye contact MSK: Nml  gait   A/P: L OM: Augmentin, partial treatment with Amox previously. C/w symptomatic care 2, 3: ST, sinus congestion, ongoing and improving
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
## 2011-05-21 ENCOUNTER — Ambulatory Visit (INDEPENDENT_AMBULATORY_CARE_PROVIDER_SITE_OTHER): Payer: BC Managed Care – PPO | Admitting: Internal Medicine

## 2011-05-21 ENCOUNTER — Encounter: Payer: Self-pay | Admitting: Internal Medicine

## 2011-05-21 DIAGNOSIS — Z23 Encounter for immunization: Secondary | ICD-10-CM

## 2011-05-21 DIAGNOSIS — Z0000 Encounter for general adult medical examination without abnormal findings: Secondary | ICD-10-CM | POA: Insufficient documentation

## 2011-05-21 LAB — CBC WITH DIFFERENTIAL/PLATELET
Basophils Absolute: 0 10*3/uL (ref 0.0–0.1)
Eosinophils Absolute: 0.1 10*3/uL (ref 0.0–0.7)
Eosinophils Relative: 2.8 % (ref 0.0–5.0)
HCT: 42.5 % (ref 39.0–52.0)
Lymphs Abs: 1.9 10*3/uL (ref 0.7–4.0)
MCHC: 34 g/dL (ref 30.0–36.0)
MCV: 93.1 fl (ref 78.0–100.0)
Monocytes Absolute: 0.3 10*3/uL (ref 0.1–1.0)
Neutrophils Relative %: 40.5 % — ABNORMAL LOW (ref 43.0–77.0)
Platelets: 232 10*3/uL (ref 150.0–400.0)
RDW: 12.5 % (ref 11.5–14.6)

## 2011-05-21 LAB — COMPREHENSIVE METABOLIC PANEL
ALT: 29 U/L (ref 0–53)
AST: 24 U/L (ref 0–37)
Albumin: 4.3 g/dL (ref 3.5–5.2)
Alkaline Phosphatase: 59 U/L (ref 39–117)
Glucose, Bld: 74 mg/dL (ref 70–99)
Potassium: 3.8 mEq/L (ref 3.5–5.1)
Sodium: 140 mEq/L (ref 135–145)
Total Bilirubin: 1 mg/dL (ref 0.3–1.2)
Total Protein: 7.7 g/dL (ref 6.0–8.3)

## 2011-05-21 LAB — LIPID PANEL
Cholesterol: 197 mg/dL (ref 0–200)
LDL Cholesterol: 142 mg/dL — ABNORMAL HIGH (ref 0–99)
Triglycerides: 49 mg/dL (ref 0.0–149.0)

## 2011-05-21 MED ORDER — MOMETASONE FUROATE 50 MCG/ACT NA SUSP: 2.0000 | Freq: Every day | NASAL | Status: DC

## 2011-05-21 NOTE — Assessment & Plan Note (Addendum)
Td 2009 flu shot  today STE discussed safe  sex discussed Labs Continue with his healthy lifestyle. Complaint of dry skin in the hands, exam is unremarkable, I wonder if he may be having a reaction to the gloves he uses at work. Recommend observation for now

## 2011-05-21 NOTE — Progress Notes (Signed)
  Subjective:    Patient ID: Maurice Hansen, male    DOB: 26-May-1981, 30 y.o.   MRN: [id number]  HPI Complete physical exam Doing well, occasionally has noted his hands to be dry, they're not itchy or scaly. He does use gloves at work from time to time  Past Medical History  Diagnosis Date  . RHINITIS 09/16/2008   Past Surgical History  Procedure Date  . Foot surgery     Right foot surgery as a child?   History   Social History  . Marital Status: Single    Spouse Name: N/A    Number of Children: 0  . Years of Education: N/A   Occupational History  . [employer]     Social History Main Topics  . Smoking status: Former Smoker    Quit date: 03/21/2011  . Smokeless tobacco: Never Used  . Alcohol Use: Yes     socially   . Drug Use: No  . Sexually Active: Not on file   Other Topics Concern  . Not on file   Social History Narrative   Originally from [place]---Moved to [place] aprox 2006---Exercise: goes to the gym x 3 /week---Diet: Does watch---   Family History  Problem Relation Age of Onset  . Thyroid disease Mother     hyperthyroid  . Heart disease Maternal Uncle   . Stroke Maternal Grandmother     GM and GF  . Diabetes      GM, GF  . Hypertension      Gparents   . Colon cancer      M Marisue [place], M Niece  . Prostate cancer Neg Hx     Review of Systems  Respiratory: Negative for cough, shortness of breath and wheezing.   Cardiovascular: Negative for chest pain and leg swelling.  Gastrointestinal: Negative for abdominal pain and blood in stool.  Genitourinary: Negative for dysuria and hematuria.  Psychiatric/Behavioral:       No anxiety depression       Objective:   Physical Exam  Constitutional: He is oriented to person, place, and time. He appears well-developed and well-nourished. No distress.  HENT:  Head: Normocephalic and atraumatic.  Neck: No thyromegaly present.  Cardiovascular: Normal rate, regular rhythm and normal heart sounds.   No murmur  heard. Pulmonary/Chest: Effort normal and breath sounds normal. No respiratory distress. He has no wheezes. He has no rales.  Abdominal: Soft. Bowel sounds are normal. He exhibits no distension. There is no tenderness. There is no rebound and no guarding.  Musculoskeletal: He exhibits no edema.  Neurological: He is alert and oriented to person, place, and time.  Skin: Skin is warm and dry. He is not diaphoretic.  Psychiatric: He has a normal mood and affect. His behavior is normal. Judgment and thought content normal.          Assessment & Plan:

## 2011-05-22 LAB — HIV ANTIBODY (ROUTINE TESTING W REFLEX): HIV: NONREACTIVE

## 2011-08-06 ENCOUNTER — Ambulatory Visit: Payer: Self-pay

## 2011-08-16 ENCOUNTER — Ambulatory Visit (INDEPENDENT_AMBULATORY_CARE_PROVIDER_SITE_OTHER): Payer: BC Managed Care – PPO | Admitting: Internal Medicine

## 2011-08-16 ENCOUNTER — Encounter: Payer: Self-pay | Admitting: Internal Medicine

## 2011-08-16 VITALS — BP 128/80 | HR 100 | Temp 98.2°F | Wt 210.0 lb

## 2011-08-16 DIAGNOSIS — J40 Bronchitis, not specified as acute or chronic: Secondary | ICD-10-CM

## 2011-08-16 MED ORDER — AZITHROMYCIN 250 MG PO TABS: ORAL_TABLET | ORAL | Status: AC

## 2011-08-16 MED ORDER — HYDROCODONE-HOMATROPINE 5-1.5 MG/5ML PO SYRP: 5.0000 mL | ORAL_SOLUTION | Freq: Four times a day (QID) | ORAL | Status: AC | PRN

## 2011-08-16 NOTE — Progress Notes (Signed)
  Subjective:    Patient ID: Maurice Hansen, male    DOB: [birth year], 31 y.o.   MRN: [id number]  HPI Acute visit Symptoms started a week ago, nose congestion, nose pressure, ears feeling clogged, subjective fever. He took some over-the-counter medicines without much help.  Past medical history Rhinitis Gonorrhea (more than once)  Past surgical history  right foot surgery as a child  Social History: original from [place], moved to [place] 2006 Single, no children  tobacco-- denies  ETOH--  rarely  exercise--  very active, goes to the gym x 3/week   Review of Systems Denies nausea or vomiting, he had mild diarrhea. In the last few days has developed cough, unable to sleep. Initially he had a lot of mucus but now is more of a dry cough    Objective:   Physical Exam  Constitutional: He appears well-developed and well-nourished. No distress.  HENT:  Head: Normocephalic and atraumatic.       Face symmetric, nose is slightly congested, sinuses nontender to palpation. Right tympanic membrane is slightly bulged, the left tympanic membrane normal. Throat without redness  Cardiovascular: Normal rate and regular rhythm.   No murmur heard. Pulmonary/Chest:       Few rhonchi with cough otherwise normal  Skin: He is not diaphoretic.      Assessment & Plan:  Bronchitis: Symptoms consistent with bronchitis, see instructions

## 2011-08-16 NOTE — Patient Instructions (Signed)
Rest, fluids , tylenol For cough, take Mucinex DM twice a day as needed  If the cough continue, use hydrocodone, will cause drowsiness  For congestion use flonase , zyrtec  Take the antibiotic as prescribed ----> zithromax Call if no better in few days Call anytime if the symptoms are severe, you have high fever, short of breath, persistent chest pain

## 2011-09-22 ENCOUNTER — Telehealth: Payer: Self-pay | Admitting: Internal Medicine

## 2011-09-22 NOTE — Telephone Encounter (Signed)
Patient would like Dr. Drue Novel to call in new rx for Cialis to [facility] on [address].

## 2011-09-22 NOTE — Telephone Encounter (Signed)
OK to fill? Please advise.

## 2011-09-23 MED ORDER — TADALAFIL 20 MG PO TABS: ORAL_TABLET | ORAL | Status: DC

## 2011-09-23 NOTE — Telephone Encounter (Signed)
cialis 20 mg 1/2 or 1 po every other day PRN #10, 3 RF

## 2011-09-23 NOTE — Telephone Encounter (Signed)
Refill done.  

## 2011-11-29 ENCOUNTER — Ambulatory Visit (INDEPENDENT_AMBULATORY_CARE_PROVIDER_SITE_OTHER): Payer: BC Managed Care – PPO | Admitting: Family Medicine

## 2011-11-29 VITALS — BP 112/70 | HR 69 | Temp 97.8°F | Resp 18 | Ht 74.0 in | Wt 212.0 lb

## 2011-11-29 DIAGNOSIS — J019 Acute sinusitis, unspecified: Secondary | ICD-10-CM

## 2011-11-29 DIAGNOSIS — J329 Chronic sinusitis, unspecified: Secondary | ICD-10-CM

## 2011-11-29 DIAGNOSIS — J029 Acute pharyngitis, unspecified: Secondary | ICD-10-CM

## 2011-11-29 LAB — POCT RAPID STREP A (OFFICE): Rapid Strep A Screen: NEGATIVE

## 2011-11-29 MED ORDER — AMOXICILLIN 875 MG PO TABS: 875.0000 mg | ORAL_TABLET | Freq: Two times a day (BID) | ORAL | Status: AC

## 2011-11-29 NOTE — Progress Notes (Signed)
31 year old gentleman who has seasonal allergies. This time of year is particularly bad for ear congestion, sore throat, and postnasal drainage. For the last several weeks he's had a scratchy throat, fullness in his ears, and mild neck soreness.  Objective: Throat is red, he has mild serous otitis bilaterally, neck is supple, respirations are normal, skin is clear of rash.  Results for orders placed in visit on 11/29/11  POCT RAPID STREP A (OFFICE)      Component Value Range   Rapid Strep A Screen Negative  Negative    A:  Allergies unresponsive to usual   P:  Amoxicillin 875 bid x 10 days Continue claritin

## 2012-08-18 ENCOUNTER — Encounter: Payer: BC Managed Care – PPO | Admitting: Internal Medicine

## 2012-09-06 ENCOUNTER — Encounter: Payer: Self-pay | Admitting: Internal Medicine

## 2012-09-06 ENCOUNTER — Ambulatory Visit (INDEPENDENT_AMBULATORY_CARE_PROVIDER_SITE_OTHER): Payer: BC Managed Care – PPO | Admitting: Internal Medicine

## 2012-09-06 VITALS — BP 128/80 | HR 69 | Temp 97.9°F | Ht 73.5 in | Wt 223.0 lb

## 2012-09-06 DIAGNOSIS — Z0000 Encounter for general adult medical examination without abnormal findings: Secondary | ICD-10-CM

## 2012-09-06 LAB — TSH: TSH: 0.66 u[IU]/mL (ref 0.35–5.50)

## 2012-09-06 LAB — COMPREHENSIVE METABOLIC PANEL
ALT: 26 U/L (ref 0–53)
AST: 24 U/L (ref 0–37)
Alkaline Phosphatase: 63 U/L (ref 39–117)
BUN: 15 mg/dL (ref 6–23)
Creatinine, Ser: 1.1 mg/dL (ref 0.4–1.5)
Total Bilirubin: 0.5 mg/dL (ref 0.3–1.2)

## 2012-09-06 LAB — LIPID PANEL
HDL: 32.9 mg/dL — ABNORMAL LOW (ref >39.00)
LDL Cholesterol: 114 mg/dL — ABNORMAL HIGH (ref 0–99)
Total CHOL/HDL Ratio: 5
Triglycerides: 87 mg/dL (ref 0.0–149.0)
VLDL: 17.4 mg/dL (ref 0.0–40.0)

## 2012-09-06 NOTE — Progress Notes (Signed)
  Subjective:    Patient ID: Maurice Hansen, male    DOB: 06-Apr-1981, 32 y.o.   MRN: [id number]  HPI CPX  Past Medical History  Diagnosis Date  . RHINITIS 09/16/2008   Past Surgical History  Procedure Laterality Date  . Foot surgery      Right foot surgery as a child?   History   Social History  . Marital Status: Single    Spouse Name: N/A    Number of Children: 1  . 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 Smoking status: Former Smoker    Quit date: 03/21/2011  . Smokeless tobacco: Never Used  . Alcohol Use: Yes     Comment: socially   . Drug Use: No  . Sexually Active: Not on file   Other Topics Concern  . Not on file   Social History Narrative   Originally from [place]---   Moved to [place] 2006---   Engaged, has 1 child --   Exercise: goes to the gym x 3 /week---   Diet: Does watch ---   Family History  Problem Relation Age of Onset  . Thyroid disease Mother     hyperthyroid  . Heart disease Maternal Uncle   . Stroke Maternal Grandmother     GM and GF  . Diabetes      GM, GF  . Hypertension      Gparents   . Colon cancer      M Marisue [place], M Niece  . Prostate cancer Neg Hx     Review of Systems  Respiratory: Negative for cough and shortness of breath.   Cardiovascular: Negative for chest pain and leg swelling.  Gastrointestinal: Negative for nausea, diarrhea and blood in stool.  Genitourinary: Negative for dysuria and hematuria.  Psychiatric/Behavioral: The patient is not nervous/anxious.       Objective:   Physical Exam  General -- alert, well-developed, BMI 29.   Neck --no thyromegaly Lungs -- normal respiratory effort, no intercostal retractions, no accessory muscle use, and normal breath sounds.   Heart-- normal rate, regular rhythm, no murmur, and no gallop.   Abdomen--soft, non-tender, no distention, no masses, no HSM, no guarding, and no rigidity.    Extremities-- no pretibial edema bilaterally Neurologic-- alert & oriented X3 and strength normal in all extremities. Psych-- Cognition and judgment appear intact. Alert and cooperative with normal attention span and concentration.  not anxious appearing and not depressed appearing.       Assessment & Plan:

## 2012-09-06 NOTE — Assessment & Plan Note (Signed)
Td 2009 STE discussed safe  sex discussed Labs BMI increased in the last 2 years from ~ 22 to 29: recommend to go back to a healthier weight w/  Diet-exercise

## 2012-09-11 ENCOUNTER — Encounter: Payer: Self-pay | Admitting: *Deleted

## 2012-10-06 ENCOUNTER — Other Ambulatory Visit: Payer: Self-pay | Admitting: Internal Medicine

## 2012-10-09 NOTE — Telephone Encounter (Signed)
Discussed with pt, he states the rx was just for allergies.

## 2012-10-09 NOTE — Telephone Encounter (Signed)
Med has not been prescribed in well over a year. OK to refill?

## 2012-10-09 NOTE — Telephone Encounter (Signed)
advise pt, will RF x 6, to be use for allergies, if he has different sx (fever, chills severe cough) or if not improving, needs OV

## 2012-10-23 ENCOUNTER — Encounter: Payer: BC Managed Care – PPO | Admitting: Internal Medicine

## 2012-11-22 ENCOUNTER — Other Ambulatory Visit: Payer: Self-pay | Admitting: Internal Medicine

## 2012-11-23 NOTE — Telephone Encounter (Signed)
Refill done.  

## 2012-12-15 ENCOUNTER — Telehealth: Payer: Self-pay | Admitting: Internal Medicine

## 2012-12-15 NOTE — Telephone Encounter (Signed)
Pt is scheduled 5.31.14.

## 2012-12-15 NOTE — Telephone Encounter (Signed)
noted 

## 2012-12-15 NOTE — Telephone Encounter (Signed)
Patient Information:  Caller Name: Hisashi  Phone: [phone]  Patient: Maurice Hansen, Maurice Hansen  Gender: Male  DOB: Jul 30, 1980  Age: 32 Years  PCP: Willow Ora  Office Follow Up:  Does the office need to follow up with this patient?: No  Instructions For The Office: N/A  RN Note:  pt is requesting an appt for Saturday.  Symptoms  Reason For Call & Symptoms: skin lump on forehead near hair line (left side).  caller reports it is getting more sore and getting larger  Reviewed Health History In EMR: Yes  Reviewed Medications In EMR: Yes  Reviewed Allergies In EMR: Yes  Reviewed Surgeries / Procedures: Yes  Date of Onset of Symptoms: 12/11/2012  Treatments Tried: aleve  Treatments Tried Worked: No  Guideline(s) Used:  Skin Lesion - Moles or Growths  Disposition Per Guideline:   See Within 3 Days in Office  Reason For Disposition Reached:   Patient wants to be seen  Advice Given:  N/A  Patient Will Follow Care Advice:  YES  Appointment Scheduled:  12/16/2012 09:15:00 Appointment Scheduled Provider:  other  (no appt found in office for today; pt requested appt for Saturday)

## 2012-12-16 ENCOUNTER — Encounter: Payer: Self-pay | Admitting: Family Medicine

## 2012-12-16 ENCOUNTER — Ambulatory Visit (INDEPENDENT_AMBULATORY_CARE_PROVIDER_SITE_OTHER): Payer: BC Managed Care – PPO | Admitting: Family Medicine

## 2012-12-16 VITALS — BP 124/78 | Temp 98.0°F | Wt 227.0 lb

## 2012-12-16 DIAGNOSIS — W57XXXA Bitten or stung by nonvenomous insect and other nonvenomous arthropods, initial encounter: Secondary | ICD-10-CM

## 2012-12-16 DIAGNOSIS — T148 Other injury of unspecified body region: Secondary | ICD-10-CM

## 2012-12-16 DIAGNOSIS — L0390 Cellulitis, unspecified: Secondary | ICD-10-CM

## 2012-12-16 DIAGNOSIS — L0291 Cutaneous abscess, unspecified: Secondary | ICD-10-CM

## 2012-12-16 DIAGNOSIS — Z23 Encounter for immunization: Secondary | ICD-10-CM

## 2012-12-16 MED ORDER — DOXYCYCLINE HYCLATE 100 MG PO TABS: 100.0000 mg | ORAL_TABLET | Freq: Two times a day (BID) | ORAL | Status: DC

## 2012-12-16 NOTE — Addendum Note (Signed)
Addended by: Azucena Freed on: 12/16/2012 09:27 AM   Modules accepted: Orders

## 2012-12-16 NOTE — Progress Notes (Signed)
Chief Complaint  Patient presents with  . bump on forehad    soreness has gotten worse    HPI:  Acute visit for bump on forehead: -started a few days ago - woke up with it, thinks insect bite -symptoms: red bump on forehead, painful, now getting better -denies: fevers, chills, malaise, NVD, itching, HA -last tetanus unknown - reports may be over ten years  ROS: See pertinent positives and negatives per HPI.  Past Medical History  Diagnosis Date  . RHINITIS 09/16/2008    Family History  Problem Relation Age of Onset  . Thyroid disease Mother     hyperthyroid  . Heart disease Maternal Uncle   . Stroke Maternal Grandmother     GM and GF  . Diabetes      GM, GF  . Hypertension      Gparents   . Colon cancer      M Marisue [place], M Niece  . Prostate cancer Neg Hx     History   Social History  . Marital Status: Single    Spouse Name: N/A    Number of Children: 1  . 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 Smoking status: Former Smoker    Quit date: 03/21/2011  . Smokeless tobacco: Never Used  . Alcohol Use: Yes     Comment: socially   . Drug Use: No  . Sexually Active: None   Other Topics Concern  . None   Social History Narrative   Originally from [place]---   Moved to [place] 2006---   Engaged, has 1 child --   Exercise: goes to the gym x 3 /week---   Diet: Does watch ---    Current outpatient prescriptions:CIALIS 20 MG tablet, TAKE 1/2 TO 1 TABLET BY MOUTH EVERY OTHER DAY AS NEEDED, Disp: 10 tablet, Rfl: 1;  NASONEX 50 MCG/ACT nasal spray, PLACE 2 SPRAYS INTO THE NOSE DAILY, Disp: 17 g, Rfl: 6;  doxycycline (VIBRA-TABS) 100 MG tablet, Take 1 tablet (100 mg total) by mouth 2 (two) times daily., Disp: 20 tablet, Rfl: 0  EXAM:  Filed Vitals:   12/16/12 0913  BP: 124/78  Temp: 98 F (36.7 C)    Body mass index is 29.54 kg/(m^2).  GENERAL: vitals reviewed and  listed above, alert, oriented, appears well hydrated and in no acute distress  HEENT: atraumatic, conjunttiva clear, no obvious abnormalities on inspection of external nose and ears  NECK: no obvious masses on inspection  SKIN: tender, erythematous papule upper forehead with a little surrounding induration  MS: moves all extremities without noticeable abnormality  PSYCH: pleasant and cooperative, no obvious depression or anxiety  ASSESSMENT AND PLAN:  Discussed the following assessment and plan:  Insect bite  Cellulitis - Plan: doxycycline (VIBRA-TABS) 100 MG tablet  -looks like insect bite, more likely reactive changes, but possible mild cellulitis - will tx with abx - risks and return precautions discussed -Patient advised to return or notify a doctor immediately if symptoms worsen or persist or new concerns arise.  There are no Patient Instructions on file for this visit.   Kriste Basque R.

## 2013-08-22 ENCOUNTER — Telehealth: Payer: Self-pay | Admitting: Internal Medicine

## 2013-08-22 NOTE — Telephone Encounter (Signed)
Patient states he needs PA for Cialis 20 mg. Patient has bcbs and gets it filled at [facility] on [address].

## 2013-08-29 NOTE — Telephone Encounter (Signed)
PA paperwork for Cialis faxed. Awaiting response. JG//CMA

## 2013-09-06 ENCOUNTER — Telehealth: Payer: Self-pay

## 2013-09-06 NOTE — Telephone Encounter (Signed)
Medication List and allergies:  Reviewed and updated  90 day supply/mail order: na Local prescriptions: [facility] on [address]  Immunizations due: UTD  A/P:   No changes to FH, PSH or Personal Hx Flu vaccine--04/2013 Tdap--11/2012 Checking on prior approval for Cialis  To Discuss with Provider: Not at this time

## 2013-09-10 ENCOUNTER — Ambulatory Visit (INDEPENDENT_AMBULATORY_CARE_PROVIDER_SITE_OTHER): Payer: BC Managed Care – PPO | Admitting: Internal Medicine

## 2013-09-10 ENCOUNTER — Encounter: Payer: Self-pay | Admitting: Internal Medicine

## 2013-09-10 VITALS — BP 130/79 | HR 69 | Temp 97.9°F | Wt 206.0 lb

## 2013-09-10 DIAGNOSIS — F411 Generalized anxiety disorder: Secondary | ICD-10-CM

## 2013-09-10 DIAGNOSIS — Z0000 Encounter for general adult medical examination without abnormal findings: Secondary | ICD-10-CM

## 2013-09-10 DIAGNOSIS — F419 Anxiety disorder, unspecified: Secondary | ICD-10-CM | POA: Insufficient documentation

## 2013-09-10 DIAGNOSIS — J309 Allergic rhinitis, unspecified: Secondary | ICD-10-CM

## 2013-09-10 LAB — CBC WITH DIFFERENTIAL/PLATELET
BASOS PCT: 0.5 % (ref 0.0–3.0)
Basophils Absolute: 0 10*3/uL (ref 0.0–0.1)
EOS ABS: 0.1 10*3/uL (ref 0.0–0.7)
Eosinophils Relative: 2.7 % (ref 0.0–5.0)
HCT: 43.1 % (ref 39.0–52.0)
HEMOGLOBIN: 13.9 g/dL (ref 13.0–17.0)
LYMPHS PCT: 46.1 % — AB (ref 12.0–46.0)
Lymphs Abs: 1.9 10*3/uL (ref 0.7–4.0)
MCHC: 32.3 g/dL (ref 30.0–36.0)
MCV: 93.3 fl (ref 78.0–100.0)
MONOS PCT: 7.7 % (ref 3.0–12.0)
Monocytes Absolute: 0.3 10*3/uL (ref 0.1–1.0)
NEUTROS ABS: 1.8 10*3/uL (ref 1.4–7.7)
NEUTROS PCT: 43 % (ref 43.0–77.0)
Platelets: 219 10*3/uL (ref 150.0–400.0)
RBC: 4.62 Mil/uL (ref 4.22–5.81)
RDW: 13.1 % (ref 11.5–14.6)
WBC: 4.2 10*3/uL — AB (ref 4.5–10.5)

## 2013-09-10 LAB — LIPID PANEL
CHOL/HDL RATIO: 4
Cholesterol: 188 mg/dL (ref 0–200)
HDL: 42.6 mg/dL (ref >39.00)
LDL CALC: 131 mg/dL — AB (ref 0–99)
TRIGLYCERIDES: 70 mg/dL (ref 0.0–149.0)
VLDL: 14 mg/dL (ref 0.0–40.0)

## 2013-09-10 LAB — BASIC METABOLIC PANEL
BUN: 15 mg/dL (ref 6–23)
CALCIUM: 9.5 mg/dL (ref 8.4–10.5)
CO2: 26 mEq/L (ref 19–32)
CREATININE: 1.1 mg/dL (ref 0.4–1.5)
Chloride: 104 mEq/L (ref 96–112)
GFR: 104.21 mL/min (ref >60.00)
Glucose, Bld: 82 mg/dL (ref 70–99)
Potassium: 4.2 mEq/L (ref 3.5–5.1)
SODIUM: 138 meq/L (ref 135–145)

## 2013-09-10 MED ORDER — LORATADINE 10 MG PO TABS: 10.0000 mg | ORAL_TABLET | Freq: Every day | ORAL | Status: DC

## 2013-09-10 NOTE — Assessment & Plan Note (Addendum)
Td 2009 Labs, states does not need to check a HIV Life style is very good, has lost some weight

## 2013-09-10 NOTE — Progress Notes (Signed)
Subjective:    Patient ID: Maurice Hansen, male    DOB: 06-27-1981, 33 y.o.   MRN: [id number]  DOS:  09/10/2013 CPX,   Also, for several years he has known he is an anxious person, symptoms are getting slightly worse, he worries sometimes too much about everything: Money, work, the future. Occasionally he feels sad, blue. Occasionally has difficulty with sleeping.  ROS Diet-- ususally ok  Exercise-- very good, exercises x 4-5/week No  CP, SOB. No palpitations Denies  nausea, vomiting diarrhea Denies  blood in the stools (-) cough, sputum production nasonex helps allergies No dysuria, gross hematuria, difficulty urinating  , penile discharge  Denies suicidal ideas    Past Medical History  Diagnosis Date  . RHINITIS 09/16/2008  . STD (male)     h/o gonorrhea     Past Surgical History  Procedure Laterality Date  . Foot surgery      Right foot surgery as a child?    History   Social History  . Marital Status: Single    Spouse Name: N/A    Number of Children: 1  . 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 Smoking status: Former Smoker    Quit date: 03/21/2011  . Smokeless tobacco: Never Used  . Alcohol Use: Yes     Comment: socially   . Drug Use: No  . Sexual Activity: Not on file   Other Topics Concern  . Not on file   Social History Narrative   Originally from [place] . Moved to [place] aprox 2006    Engaged, has 1 child            Family History  Problem Relation Age of Onset  . Thyroid disease Mother     hyperthyroid  . Heart disease Maternal Uncle   . Stroke Maternal Grandmother     GM and GF  . Diabetes Other     GM, GF  . Hypertension Other     Gparents   . Colon cancer Other     M Marisue [place], M Niece  . Prostate cancer Neg Hx        Medication List       This list is accurate as of: 09/10/13  5:19 PM.  Always use your most recent med list.               CIALIS 20 MG tablet  Generic drug:  tadalafil  TAKE 1/2 TO 1 TABLET BY MOUTH EVERY OTHER DAY AS NEEDED     loratadine 10 MG tablet  Commonly known as:  CLARITIN  Take 1 tablet (10 mg total) by mouth daily.     NASONEX 50 MCG/ACT nasal spray  Generic drug:  mometasone  PLACE 2 SPRAYS INTO THE NOSE DAILY           Objective:   Physical Exam BP 130/79  Pulse 69  Temp(Src) 97.9 F (36.6 C)  Wt 206 lb (93.441 kg)  SpO2 99% General -- alert, well-developed, NAD.  Neck --no thyromegaly   HEENT-- Not pale.  Lungs -- normal respiratory effort, no intercostal retractions, no accessory muscle use, and normal breath sounds.  Heart-- normal rate, regular rhythm, no murmur.  Abdomen-- Not distended, good bowel sounds,soft, non-tender.  Extremities-- no pretibial edema bilaterally  Neurologic--  alert & oriented X3. Speech normal, gait normal, strength normal in all  extremities.  Psych-- Cognition and judgment appear intact. Cooperative with normal attention span and concentration. No anxious or depressed appearing.     Assessment & Plan:

## 2013-09-10 NOTE — Patient Instructions (Signed)
Get your blood work before you leave   consider see a counselor MELATONIN OTC is great to help with difficulty sleeping Call in 2 months if anxiety is not improving    Next visit is for a physical exam in 1 year  fasting Please make an appointment

## 2013-09-10 NOTE — Progress Notes (Signed)
Pre visit review using our clinic review tool, if applicable. No additional management support is needed unless otherwise documented below in the visit note. 

## 2013-09-10 NOTE — Assessment & Plan Note (Addendum)
See HPI, symptoms consistent with anxiety. PHQ 9-- 6  Discussed with him treatment modalities, strongly encourage to start w/  counseling. He also has  insomnia, recommend melatonin He will call in a couple months if he is not improving, we'll consider SSRI, did discuss with him some of the side effects including nausea and decreased libido.

## 2013-09-10 NOTE — Assessment & Plan Note (Signed)
On flonase, needs a rx for claritin

## 2013-09-10 NOTE — Telephone Encounter (Signed)
Cialis prior authorization denied. His plan does cover Viagra. Please advise.

## 2013-09-10 NOTE — Telephone Encounter (Signed)
Advise patient, I am ok to prescribe  Viagra 100 mg half or one tablet daily as needed #10 and 3 refills; if he is in agreement please send a prescription

## 2013-09-12 ENCOUNTER — Encounter: Payer: Self-pay | Admitting: *Deleted

## 2013-09-12 ENCOUNTER — Other Ambulatory Visit: Payer: Self-pay | Admitting: *Deleted

## 2013-09-12 MED ORDER — SILDENAFIL CITRATE 100 MG PO TABS: 100.0000 mg | ORAL_TABLET | Freq: Every day | ORAL | Status: DC | PRN

## 2013-09-12 NOTE — Telephone Encounter (Signed)
Done. JG//CMA 

## 2013-09-19 ENCOUNTER — Telehealth: Payer: Self-pay | Admitting: *Deleted

## 2013-09-19 NOTE — Telephone Encounter (Signed)
[facility]ial[facility], 4 tabs per 30 days. Reference #: [id number]

## 2013-10-28 ENCOUNTER — Other Ambulatory Visit: Payer: Self-pay | Admitting: Internal Medicine

## 2013-11-30 ENCOUNTER — Telehealth: Payer: Self-pay | Admitting: Internal Medicine

## 2013-11-30 MED ORDER — ESCITALOPRAM OXALATE 10 MG PO TABS: 10.0000 mg | ORAL_TABLET | Freq: Every day | ORAL | Status: DC

## 2013-11-30 NOTE — Telephone Encounter (Signed)
Per our conversation during the visit, I recommend Lexapro 10 mg one by mouth daily, #30 and one refill. Please schedule a OV  4 weeks from today. Advise patient if he has side effects or  suicidal thoughts he needs to stop Lexapro and call me immediately

## 2013-11-30 NOTE — Telephone Encounter (Signed)
Please advise 

## 2013-11-30 NOTE — Telephone Encounter (Signed)
Caller name:Maurice Hansen Relation to JX:BJYNWGNpt:patient Call back number: [phone] Pharmacy:  Reason for call: patient called stating that his anxiety still has not improved and Dr Drue NovelPaz wanted him to let him know. Please advise.

## 2013-11-30 NOTE — Telephone Encounter (Signed)
Pt notified.  Verbalized understanding.

## 2014-03-16 ENCOUNTER — Other Ambulatory Visit: Payer: Self-pay | Admitting: Internal Medicine

## 2014-03-22 ENCOUNTER — Encounter: Payer: Self-pay | Admitting: Internal Medicine

## 2014-03-22 ENCOUNTER — Ambulatory Visit (INDEPENDENT_AMBULATORY_CARE_PROVIDER_SITE_OTHER): Payer: BC Managed Care – PPO | Admitting: Internal Medicine

## 2014-03-22 VITALS — BP 124/76 | HR 68 | Temp 98.2°F | Wt 191.6 lb

## 2014-03-22 DIAGNOSIS — F411 Generalized anxiety disorder: Secondary | ICD-10-CM

## 2014-03-22 MED ORDER — VENLAFAXINE HCL ER 75 MG PO CP24: ORAL_CAPSULE | ORAL | Status: DC

## 2014-03-22 NOTE — Assessment & Plan Note (Addendum)
Since the last time he was here, is taking melatonin and it helped insomnia, we also started Lexapro, it help but is making him feel numb and has some  decreased libido. Options discussed: add Wellbutrin versus switch to effexor. Elected to Effexor, prescription provided, we'll come back in 6 weeks and call as needed.

## 2014-03-22 NOTE — Patient Instructions (Signed)
Stop escitalopram Start effexor:  one tablet in the morning for 10 days, then 2 tablets in the morning   Please come back to the office in 6 weeks , no need to be  fasting   Stop by the front desk and schedule the visit    Reason for the visit: follow up

## 2014-03-22 NOTE — Progress Notes (Signed)
Pre visit review using our clinic review tool, if applicable. No additional management support is needed unless otherwise documented below in the visit note. 

## 2014-03-22 NOTE — Progress Notes (Signed)
   Subjective:    Patient ID: Maurice Hansen, male    DOB: [birth year], 33 y.o.   MRN: [id number]  DOS:  03/22/2014 Type of visit - description : f/u Interval history: Was vivsit he was dx w/  anxiety. Was unable to see a counselor He is taking melatonin and is helping some  with sleeping. he eventually was  started on Lexapro 10 mg few weeks ago, he noticed it did help but however he feels "numb" and he doesn't like that feeling much, also has decreased libido.    ROS Denies any suicidal ideas or depression. I noted weight loss, reports that he is doing better with exercise.   Past Medical History  Diagnosis Date  . RHINITIS 09/16/2008  . STD (male)     h/o gonorrhea     Past Surgical History  Procedure Laterality Date  . Foot surgery      Right foot surgery as a child?    History   Social History  . Marital Status: Single    Spouse Name: N/A    Number of Children: 1  . 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 Smoking status: Former Smoker    Quit date: 03/21/2011  . Smokeless tobacco: Never Used  . Alcohol Use: Yes     Comment: socially   . Drug Use: No  . Sexual Activity: Not on file   Other Topics Concern  . Not on file   Social History Narrative   Originally from [place] . Moved to [place] aprox 2006    Engaged, has 1 child               Medication List       This list is accurate as of: 03/22/14 11:59 PM.  Always use your most recent med list.               CIALIS 20 MG tablet  Generic drug:  tadalafil  TAKE ONE-HALF TO ONE TABLET BY MOUTH EVERY OTHER DAY AS NEEDED.     loratadine 10 MG tablet  Commonly known as:  CLARITIN  Take 1 tablet (10 mg total) by mouth daily.     NASONEX 50 MCG/ACT nasal spray  Generic drug:  mometasone  PLACE TWO SPRAYS INTO THE NOSE DAILY     venlafaxine XR 75 MG 24 hr capsule  Commonly known as:  EFFEXOR-XR  1 tablet a day for  10 days , then 2 tablets a day in the mornings           Objective:   Physical Exam BP 124/76  Pulse 68  Temp(Src) 98.2 F (36.8 C) (Oral)  Wt 191 lb 9.3 oz (86.9 kg)  SpO2 98%  General -- alert, well-developed, NAD.   Neurologic--  alert & oriented X3. Speech normal, gait appropriate for age, strength symmetric and appropriate for age.    Psych-- Cognition and judgment appear intact. Cooperative with normal attention span and concentration. No anxious or depressed appearing.       Assessment & Plan:

## 2014-05-10 ENCOUNTER — Encounter: Payer: Self-pay | Admitting: Internal Medicine

## 2014-05-10 ENCOUNTER — Ambulatory Visit (INDEPENDENT_AMBULATORY_CARE_PROVIDER_SITE_OTHER): Payer: BC Managed Care – PPO | Admitting: Internal Medicine

## 2014-05-10 VITALS — BP 125/79 | HR 59 | Temp 98.0°F | Wt 189.5 lb

## 2014-05-10 DIAGNOSIS — F419 Anxiety disorder, unspecified: Secondary | ICD-10-CM

## 2014-05-10 DIAGNOSIS — G4700 Insomnia, unspecified: Secondary | ICD-10-CM | POA: Insufficient documentation

## 2014-05-10 NOTE — Assessment & Plan Note (Signed)
See previous entry, doing well with Effexor. Sometimes forgets to take it and feels poorly, strongly encouraged to take it every day. Reassess 2-16 at the time of his cpx

## 2014-05-10 NOTE — Progress Notes (Signed)
Pre visit review using our clinic review tool, if applicable. No additional management support is needed unless otherwise documented below in the visit note. 

## 2014-05-10 NOTE — Assessment & Plan Note (Signed)
Well-controlled with melatonin as needed although will sometimes feels he is developing tolerance. Plan: Continue with melatonin, healthy sleep discussed, see instructions

## 2014-05-10 NOTE — Progress Notes (Signed)
   Subjective:    Patient ID: Maurice Hansen, male    DOB: 03/29/1981, 33 y.o.   MRN: [id number]  DOS:  05/10/2014 Type of visit - description : f/u Interval history: Was switch to effexor, feeling well except when he forgets to take it sometimes, change meds?. Denies the  "numb" feeling he used to have w/ SSRIs. Insomnia, sleeping okay most nights when he takes melatonin   ROS Denies nausea, vomiting, diarrhea. No  constipation. No suicidal ideas. No difficulty urinating. Continue with stress, mother has cancer, he is busy taking care of her.  Past Medical History  Diagnosis Date  . RHINITIS 09/16/2008  . STD (male)     h/o gonorrhea   . Anxiety     Past Surgical History  Procedure Laterality Date  . Foot surgery      Right foot surgery as a child?    History   Social History  . Marital Status: Single    Spouse Name: N/A    Number of Children: 1  . 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 Smoking status: Former Smoker    Quit date: 03/21/2011  . Smokeless tobacco: Never Used  . Alcohol Use: Yes     Comment: socially   . Drug Use: No  . Sexual Activity: Not on file   Other Topics Concern  . Not on file   Social History Narrative   Originally from [place] . Moved to [place] aprox 2006    Engaged, has 1 child               Medication List       This list is accurate as of: 05/10/14 11:59 PM.  Always use your most recent med list.               CIALIS 20 MG tablet  Generic drug:  tadalafil  TAKE ONE-HALF TO ONE TABLET BY MOUTH EVERY OTHER DAY AS NEEDED.     loratadine 10 MG tablet  Commonly known as:  CLARITIN  Take 1 tablet (10 mg total) by mouth daily.     NASONEX 50 MCG/ACT nasal spray  Generic drug:  mometasone  PLACE TWO SPRAYS INTO THE NOSE DAILY     venlafaxine XR 75 MG 24 hr capsule  Commonly known as:  EFFEXOR-XR  1 tablet a day for 10 days , then 2  tablets a day in the mornings           Objective:   Physical Exam BP 125/79  Pulse 59  Temp(Src) 98 F (36.7 C) (Oral)  Wt 189 lb 8 oz (85.957 kg)  SpO2 98% General -- alert, well-developed, NAD.  Neurologic--  alert & oriented X3. Speech normal, gait appropriate for age, strength symmetric and appropriate for age.    Psych-- Cognition and judgment appear intact. Cooperative with normal attention span and concentration. No anxious or depressed appearing.        Assessment & Plan:

## 2014-05-10 NOTE — Patient Instructions (Signed)
Continue taking the same meds  Call for refill  Next visit by 08-2014 for a physical   HEALTHY SLEEP Sleep hygiene: Basic rules for a good night's sleep  Sleep only as much as you need to feel rested and then get out of bed  Keep a regular sleep schedule  Avoid forcing sleep  Exercise regularly for at least 20 minutes, preferably 4 to 5 hours before bedtime  Avoid caffeinated beverages after lunch  Avoid alcohol near bedtime: no "night cap"  Avoid smoking, especially in the evening  Do not go to bed hungry  Adjust bedroom environment  Avoid prolonged use of light-emitting screens before bedtime   Deal with your worries before bedtime

## 2014-05-17 ENCOUNTER — Telehealth: Payer: Self-pay | Admitting: Internal Medicine

## 2014-05-17 MED ORDER — ZOLPIDEM TARTRATE 10 MG PO TABS: 5.0000 mg | ORAL_TABLET | Freq: Every evening | ORAL | Status: DC | PRN

## 2014-05-17 NOTE — Telephone Encounter (Signed)
Spoke with Pt, informed him [facility]. Informed him he can continue taking melatonin for sleep as well.

## 2014-05-17 NOTE — Telephone Encounter (Signed)
Caller name: Thereasa DistanceRodney  Call back number:[phone] [facility]   Reason for call: pt was seen on 10/23, was instructed to call back if no better.  Still having problem sleeping.  Was told he could get something called in.  advise

## 2014-05-17 NOTE — Telephone Encounter (Signed)
Recommend to try Ambien, okay to continue melatonin. Prescription printed

## 2014-05-17 NOTE — Telephone Encounter (Signed)
Please advise 

## 2015-04-16 ENCOUNTER — Other Ambulatory Visit: Payer: Self-pay | Admitting: Internal Medicine

## 2015-04-16 NOTE — Telephone Encounter (Signed)
Okay #10, no refills 

## 2015-04-16 NOTE — Telephone Encounter (Signed)
Rx sent 

## 2015-04-16 NOTE — Telephone Encounter (Signed)
Pt is requesting refill on Cialis.  Last OV: 05/10/2014, no future appts scheduled Last Fill: 03/18/2014 #10 0RF  Please advise.

## 2015-06-11 ENCOUNTER — Other Ambulatory Visit: Payer: Self-pay | Admitting: Internal Medicine

## 2015-06-11 MED ORDER — TADALAFIL 20 MG PO TABS: 10.0000 mg | ORAL_TABLET | ORAL | Status: DC

## 2015-06-11 MED ORDER — ZOLPIDEM TARTRATE 10 MG PO TABS: 5.0000 mg | ORAL_TABLET | Freq: Every evening | ORAL | Status: DC | PRN

## 2015-06-11 NOTE — Telephone Encounter (Signed)
Pt is requesting refill on Ambien and Cialis.  Last OV: 05/10/2014, CPE scheduled 10/29/2015 at 10:00 Last Fill on Cialis: 04/16/2015 #10 and 0RF Last Fill on Ambien: 05/17/2014 #30 and 0RF UDS: Not needed  Please advise if Pt needs F/U appt before CPE in April 2017.

## 2015-06-11 NOTE — Telephone Encounter (Signed)
Okay #10 and 3 RF cialis  and #30 AMBIEN

## 2015-06-11 NOTE — Telephone Encounter (Addendum)
[facility]. Pt informed via MyChart.

## 2015-10-29 ENCOUNTER — Encounter: Payer: Self-pay | Admitting: Internal Medicine

## 2015-12-05 ENCOUNTER — Telehealth: Payer: Self-pay | Admitting: Internal Medicine

## 2015-12-05 NOTE — Telephone Encounter (Signed)
Can be reached: [phone] Pharmacy: [facility] [address] - [place], [place] - [id number]  Reason for call: Pt states that [facility] and was informed PA required. He said insurance told him to call PCP office. [facility] said the form was already faxed for Cialis PA. Pt states insurance said if we call it will take 2-3 minutes for approval - ph # [phone] [facility]([employer]).

## 2015-12-08 ENCOUNTER — Other Ambulatory Visit: Payer: Self-pay | Admitting: Internal Medicine

## 2015-12-08 ENCOUNTER — Ambulatory Visit (INDEPENDENT_AMBULATORY_CARE_PROVIDER_SITE_OTHER): Payer: 59 | Admitting: Internal Medicine

## 2015-12-08 ENCOUNTER — Encounter: Payer: Self-pay | Admitting: Internal Medicine

## 2015-12-08 VITALS — BP 108/72 | HR 70 | Temp 98.2°F | Ht 73.0 in | Wt 176.5 lb

## 2015-12-08 DIAGNOSIS — Z09 Encounter for follow-up examination after completed treatment for conditions other than malignant neoplasm: Secondary | ICD-10-CM

## 2015-12-08 DIAGNOSIS — Z0000 Encounter for general adult medical examination without abnormal findings: Secondary | ICD-10-CM | POA: Diagnosis not present

## 2015-12-08 MED ORDER — ESZOPICLONE 3 MG PO TABS
3.0000 mg | ORAL_TABLET | Freq: Every evening | ORAL | Status: DC | PRN
Start: 2015-12-08 — End: 2017-01-14

## 2015-12-08 MED ORDER — SILDENAFIL CITRATE 20 MG PO TABS: 60.0000 mg | ORAL_TABLET | Freq: Every evening | ORAL | Status: DC | PRN

## 2015-12-08 NOTE — Progress Notes (Signed)
Subjective:    Patient ID: Maurice Hansen, male    DOB: 03/05/1981, 35 y.o.   MRN: [id number]  DOS:  12/08/2015 Type of visit - description : CPX Interval history: Still having difficulty sleeping on and off, ambien doesn't work sometimes. Would like to change to something different.   Review of Systems Constitutional: No fever. No chills. No unexplained wt changes. No unusual sweats  HEENT: No dental problems, no ear discharge, no facial swelling, no voice changes. No eye discharge, no eye  redness , no  intolerance to light   Respiratory: No wheezing , no  difficulty breathing. No cough , no mucus production  Cardiovascular: No CP, no leg swelling , no  Palpitations  GI: no nausea, no vomiting, no diarrhea , no  abdominal pain.  No blood in the stools. No dysphagia, no odynophagia    Endocrine: No polyphagia, no polyuria , no polydipsia  GU: No dysuria, gross hematuria, difficulty urinating. No urinary urgency, no frequency.  Musculoskeletal: No joint swellings or unusual aches or pains  Skin: No change in the color of the skin, palor , no  Rash  Allergic, immunologic: No environmental allergies , no  food allergies  Neurological: No dizziness no  syncope. No headaches. No diplopia, no slurred, no slurred speech, no motor deficits, no facial  Numbness  Hematological: No enlarged lymph nodes, no easy bruising , no unusual bleedings  Psychiatry: No suicidal ideas, no hallucinations, no beavior problems, no confusion.  No unusual/severe anxiety, no depression   Past Medical History  Diagnosis Date  . RHINITIS 09/16/2008  . STD (male)     h/o gonorrhea   . Anxiety     Past Surgical History  Procedure Laterality Date  . Foot surgery      Right foot surgery as a child?    Social History   Social History  . Marital Status: Single    Spouse Name: N/A  . Number of Children: 1  . Years of Education: N/A   Occupational History  . 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 Smoking status: Former Smoker    Quit date: 03/21/2011  . Smokeless tobacco: Never Used  . Alcohol Use: Yes     Comment: socially   . Drug Use: No  . Sexual Activity: Not on file   Other Topics Concern  . Not on file   Social History Narrative   Originally from [place] . Moved to [place] aprox 2006    Engaged    has 1 child            Family History  Problem Relation Age of Onset  . Thyroid disease Mother     hyperthyroid  . Heart disease Maternal Uncle   . Stroke Maternal Grandmother     GM and GF  . Diabetes Other     GM, GF  . Hypertension Other     Gparents   . Colon cancer Other     M Marisue BrooklynGreat Uncle, M Niece  . Prostate cancer Neg Hx   . Colon cancer Father     early 4070s  . Breast cancer Mother        Medication List       This list is accurate as of: 12/08/15 11:59 PM.  Always use your most recent med list.               Eszopiclone 3 MG  Tabs  Commonly known as:  eszopiclone  Take 1 tablet (3 mg total) by mouth at bedtime as needed. Take immediately before bedtime     fluticasone 50 MCG/ACT nasal spray  Commonly known as:  FLONASE  Place 1-2 sprays into both nostrils daily.     loratadine 10 MG tablet  Commonly known as:  CLARITIN  Take 1 tablet (10 mg total) by mouth daily.     sildenafil 20 MG tablet  Commonly known as:  REVATIO  Take 3-4 tablets (60-80 mg total) by mouth at bedtime as needed.           Objective:   Physical Exam BP 108/72 mmHg  Pulse 70  Temp(Src) 98.2 F (36.8 C) (Oral)  Ht  (1.854 m)  Wt 176 lb 8 oz (80.06 kg)  BMI 23.29 kg/m2  SpO2 98%  General:   Well developed, well nourished . NAD.  Neck: No  thyromegaly  HEENT:  Normocephalic . Face symmetric, atraumatic Lungs:  CTA B Normal respiratory effort, no intercostal retractions, no accessory muscle use. Heart: RRR,  no murmur.  No pretibial edema bilaterally  Abdomen:  Not distended, soft, non-tender. No rebound or  rigidity.   Skin: Exposed areas without rash. Not pale. Not jaundice Neurologic:  alert & oriented X3.  Speech normal, gait appropriate for age and unassisted Strength symmetric and appropriate for age.  Psych: Cognition and judgment appear intact.  Cooperative with normal attention span and concentration.  Behavior appropriate. No anxious or depressed appearing.    Assessment & Plan:   Assessment  Anxiety, insomnia  ED H/o gonorrhea  Plan: Anxiety, insomnia: Anxiety is not an issue at this point, still has occasional problems sleeping, Ambien does not work sometimes. Will change to [facility] ED: Cialis not covered, rx sildenafil. RTC one year

## 2015-12-08 NOTE — Progress Notes (Signed)
Pre visit review using our clinic review tool, if applicable. No additional management support is needed unless otherwise documented below in the visit note. 

## 2015-12-08 NOTE — Patient Instructions (Addendum)
GO TO THE LAB : Get the blood work     GO TO THE FRONT DESK Schedule your next appointment for a physical exam in 1 year    For sleep: Start eszociplone as needed  HEALTHY SLEEP Sleep hygiene: Basic rules for a good night's sleep  Sleep only as much as you need to feel rested and then get out of bed  Keep a regular sleep schedule  Avoid forcing sleep  Exercise regularly for at least 20 minutes, preferably 4 to 5 hours before bedtime  Avoid caffeinated beverages after lunch  Avoid alcohol near bedtime: no "night cap"  Avoid smoking, especially in the evening  Do not go to bed hungry  Adjust bedroom environment  Avoid prolonged use of light-emitting screens before bedtime   Deal with your worries before bedtime       Testicular Self-Exam A self-examination of your testicles involves looking at and feeling your testicles for abnormal lumps or swelling. Several things can cause swelling, lumps, or pain in your testicles. Some of these causes are:  Injuries.  Inflammation.  Infection.  Accumulation of fluids around your testicle (hydrocele).  Twisted testicles (testicular torsion).  Testicular cancer. Self-examination of the testicles and groin areas may be advised if you are at risk for testicular cancer. Risks for testicular cancer include:  An undescended testicle (cryptorchidism).  A history of previous testicular cancer.  A family history of testicular cancer. The testicles are easiest to examine after warm baths or showers and are more difficult to examine when you are cold. This is because the muscles attached to the testicles retract and pull them up higher or into the abdomen. Follow these steps while you are standing:  Hold your penis away from your body.  Roll one testicle between your thumb and forefinger, feeling the entire testicle.  Roll the other testicle between your thumb and forefinger, feeling the entire testicle. Feel for lumps, swelling, or  discomfort. A normal testicle is egg shaped and feels firm. It is smooth and not tender. The spermatic cord can be felt as a firm spaghetti-like cord at the back of your testicle. It is also important to examine the crease between the front of your leg and your abdomen. Feel for any bumps that are tender. These could be enlarged lymph nodes.    This information is not intended to replace advice given to you by your health care provider. Make sure you discuss any questions you have with your health care provider.   Document Released: 10/11/2000 Document Revised: 03/07/2013 Document Reviewed: 12/25/2012 Elsevier Interactive Patient Education [employer]2016 Elsevier Inc.

## 2015-12-08 NOTE — Telephone Encounter (Signed)
Pt called back in to check the status of PA. Informed pt of PA process.  Pt says that he would like a direct call back to discuss further because he feels that someone could just call number below to have authorized .     Please advise pt directly.   [phone]

## 2015-12-08 NOTE — Assessment & Plan Note (Addendum)
Td 2009  + FH colon cancer, father, in his 2870s Doing well with diet, very active at work, goes to the gym 3 times a week. Labs: CMP, FLP, ABC, HIV. Counseled: STE, reports he does practice safe sex.

## 2015-12-08 NOTE — Telephone Encounter (Signed)
Pt has not been seen since 2015. He is scheduled to see Dr. Drue NovelPaz today. A new insurance card needs to be scanned into his chart and if Dr. Drue NovelPaz wishes to continue with the medication, the PA process can be initiated. JG//CMA

## 2015-12-09 ENCOUNTER — Other Ambulatory Visit: Payer: Self-pay

## 2015-12-09 DIAGNOSIS — Z09 Encounter for follow-up examination after completed treatment for conditions other than malignant neoplasm: Secondary | ICD-10-CM | POA: Insufficient documentation

## 2015-12-09 LAB — COMPREHENSIVE METABOLIC PANEL
ALT: 19 U/L (ref 0–53)
AST: 22 U/L (ref 0–37)
Albumin: 4.6 g/dL (ref 3.5–5.2)
Alkaline Phosphatase: 54 U/L (ref 39–117)
BILIRUBIN TOTAL: 0.6 mg/dL (ref 0.2–1.2)
BUN: 16 mg/dL (ref 6–23)
CHLORIDE: 104 meq/L (ref 96–112)
CO2: 30 meq/L (ref 19–32)
CREATININE: 1.04 mg/dL (ref 0.40–1.50)
Calcium: 9.6 mg/dL (ref 8.4–10.5)
GFR: 104 mL/min (ref >60.00)
GLUCOSE: 81 mg/dL (ref 70–99)
Potassium: 4.4 mEq/L (ref 3.5–5.1)
Sodium: 138 mEq/L (ref 135–145)
Total Protein: 7.3 g/dL (ref 6.0–8.3)

## 2015-12-09 LAB — CBC WITH DIFFERENTIAL/PLATELET
BASOS ABS: 0 10*3/uL (ref 0.0–0.1)
BASOS PCT: 0.9 % (ref 0.0–3.0)
Eosinophils Absolute: 0.1 10*3/uL (ref 0.0–0.7)
Eosinophils Relative: 2.4 % (ref 0.0–5.0)
HCT: 43.8 % (ref 39.0–52.0)
Hemoglobin: 14.9 g/dL (ref 13.0–17.0)
LYMPHS ABS: 2 10*3/uL (ref 0.7–4.0)
Lymphocytes Relative: 52.2 % — ABNORMAL HIGH (ref 12.0–46.0)
MCHC: 33.9 g/dL (ref 30.0–36.0)
MCV: 91 fl (ref 78.0–100.0)
Monocytes Absolute: 0.3 10*3/uL (ref 0.1–1.0)
Monocytes Relative: 8.3 % (ref 3.0–12.0)
NEUTROS PCT: 36.2 % — AB (ref 43.0–77.0)
Neutro Abs: 1.4 10*3/uL (ref 1.4–7.7)
Platelets: 232 10*3/uL (ref 150.0–400.0)
RBC: 4.82 Mil/uL (ref 4.22–5.81)
RDW: 12.3 % (ref 11.5–15.5)
WBC: 3.9 10*3/uL — ABNORMAL LOW (ref 4.0–10.5)

## 2015-12-09 LAB — HIV ANTIBODY (ROUTINE TESTING W REFLEX): HIV: NONREACTIVE

## 2015-12-09 LAB — LIPID PANEL
CHOL/HDL RATIO: 4
Cholesterol: 178 mg/dL (ref 0–200)
HDL: 43.1 mg/dL (ref >39.00)
LDL Cholesterol: 122 mg/dL — ABNORMAL HIGH (ref 0–99)
NonHDL: 134.55
Triglycerides: 64 mg/dL (ref 0.0–149.0)
VLDL: 12.8 mg/dL (ref 0.0–40.0)

## 2015-12-09 MED ORDER — FLUTICASONE PROPIONATE 50 MCG/ACT NA SUSP: 1.0000 | Freq: Every day | NASAL | Status: DC

## 2015-12-09 NOTE — Assessment & Plan Note (Signed)
Anxiety, insomnia: Anxiety is not an issue at this point, still has occasional problems sleeping, Ambien does not work sometimes. Will change to [facility] ED: Cialis not covered, rx sildenafil. RTC one year

## 2016-01-14 ENCOUNTER — Encounter: Payer: Self-pay | Admitting: Internal Medicine

## 2016-04-08 ENCOUNTER — Telehealth: Payer: Self-pay

## 2016-04-08 NOTE — Telephone Encounter (Signed)
PA initiated via Covermymeds; KEY: [id number]. Awaiting determination.

## 2016-04-08 NOTE — Telephone Encounter (Signed)
Received PA denial, no alternatives given. Reason given: coverage is only provided for those w/ pulmonary hypertension. [facility] informed of PA decision and denial letter sent for scanning.

## 2016-12-09 ENCOUNTER — Encounter: Payer: 59 | Admitting: Internal Medicine

## 2017-01-14 ENCOUNTER — Other Ambulatory Visit: Payer: Self-pay | Admitting: Internal Medicine

## 2017-01-14 NOTE — Telephone Encounter (Signed)
Rx faxed to [facility] pharmacy  

## 2017-01-14 NOTE — Telephone Encounter (Signed)
Pt is requesting refill on Lunesta 3mg  and sildenafil 20mg .  Last OV: 12/08/2015, cpe scheduled 02/11/2017 Last Fill: 12/08/2015 on Lunesta: #30 and 1RF Last Fill 12/08/2015 on sildenafil: #30 and 3RF UDS: None  [place] Controlled Substance Database printed.  Please adivse.

## 2017-01-14 NOTE — Telephone Encounter (Signed)
Okay one month of each

## 2017-01-14 NOTE — Telephone Encounter (Signed)
Rx's printed, awaiting MD signature.  

## 2017-02-11 ENCOUNTER — Encounter: Payer: Self-pay | Admitting: Internal Medicine

## 2017-02-28 ENCOUNTER — Encounter: Payer: Self-pay | Admitting: Internal Medicine

## 2017-02-28 DIAGNOSIS — Z0289 Encounter for other administrative examinations: Secondary | ICD-10-CM

## 2017-03-04 ENCOUNTER — Telehealth: Payer: Self-pay | Admitting: Internal Medicine

## 2017-03-04 NOTE — Telephone Encounter (Signed)
Pt called in because he received a no show  for DOS 02/28/17, pt says that he cancelled via the automated system.. he's not sure why it didn't cancel.    CB: [id number]   Pt would not like to be charged.

## 2017-03-04 NOTE — Telephone Encounter (Signed)
Ok, no charge  

## 2017-03-18 NOTE — Telephone Encounter (Signed)
lvm advising patient of message below °

## 2017-03-18 NOTE — Telephone Encounter (Signed)
Fee has been waived °

## 2017-03-18 NOTE — Telephone Encounter (Addendum)
Patient received $50 no show bill yesterday regarding 02/28/17 DOS, please advise as per Dr. Drue NovelPaz waived no show fee and patient would like to know why he received a bill, please advise

## 2017-06-22 ENCOUNTER — Telehealth: Payer: Self-pay | Admitting: Internal Medicine

## 2017-07-13 ENCOUNTER — Telehealth: Payer: Self-pay | Admitting: Internal Medicine

## 2017-07-13 NOTE — Telephone Encounter (Signed)
Pt called in to follow up on his refill request for Loratadine. Pt scheduled his CPE on PCP next available. Pt would like to know if provider could provide him with a Rx until apt? (10/05/17)     Please advise.

## 2017-07-13 NOTE — Telephone Encounter (Signed)
Noted  

## 2017-07-13 NOTE — Telephone Encounter (Signed)
Left pt a voicemail advising him that a refill is unavailable until he is seen and recommended that he get Claritin.

## 2017-07-13 NOTE — Telephone Encounter (Signed)
Please inform Pt that loratadine is OTC Claritin, last OV w/ PCP 11/2015- unable to refill until seen- recommend getting OTC.

## 2017-07-13 NOTE — Telephone Encounter (Signed)
Pt. stated he was returning Dr. Leta JunglingPaz's nurse's phone call.  Verb. he already knew that Dr. Drue NovelPaz wanted him to schedule office appt., to get refill on his Loratadine, but wanted to discuss this further with his nurse.  Stated he wanted to discuss other medications that he will need refilled, too.  Explained to pt. that he hasn't been seen, in the office, since 11/2015, and for Dr. Drue NovelPaz to continue to manage his medication, an office appt. is required every 12 months; informed this was a standard policy.  The pt. stated "I can't wait until my physical in March to discuss the medications I need."  Offered to schedule an appt. to discuss medication refills, in addition to the CPE, scheduled in March.  The pt. declined, stating he didn't want to make an appt. just to talk about the medications he needs.  Stated "I may just have to look at changing my Primary Care doctor, cause this is ridiculous!"  The pt. promptly hung up the phone.

## 2017-10-05 ENCOUNTER — Encounter: Payer: Self-pay | Admitting: Internal Medicine

## 2017-10-21 ENCOUNTER — Telehealth: Payer: Self-pay | Admitting: Internal Medicine

## 2017-10-21 NOTE — Telephone Encounter (Unsigned)
Copied from CRM [phone]. Topic: Complaint - Billing/Coding >> Oct 21, 2017  3:09 PM Floria RavelingStovall, Shana A wrote: Patient name/MRN/Acct #:  DOS: 02/28/2017 Details of complaint: pt  stated that he cancelled this though the automated system and then call to off later that day and spoke to tasha to cancel  How would the patient like to see it resolved?  Would like thi waived  On a scale of 1-10, how was your experience?  What would it take to bring it to a 10?  Best number [phone]  Will automatically be routed to [facility] Coding pool.

## 2017-11-03 ENCOUNTER — Ambulatory Visit: Payer: BC Managed Care – PPO | Admitting: Medical

## 2017-11-07 ENCOUNTER — Encounter: Payer: Self-pay | Admitting: Medical

## 2017-11-07 ENCOUNTER — Ambulatory Visit: Payer: BC Managed Care – PPO | Admitting: Medical

## 2017-11-07 ENCOUNTER — Other Ambulatory Visit (HOSPITAL_COMMUNITY)
Admission: RE | Admit: 2017-11-07 | Discharge: 2017-11-07 | Disposition: A | Discharge status: Home / Self Care | Payer: BC Managed Care – PPO | Source: Ambulatory Visit | Attending: Internal Medicine | Admitting: Internal Medicine

## 2017-11-07 VITALS — BP 118/72 | HR 66 | Temp 97.9°F | Resp 16 | Ht 73.0 in | Wt 184.2 lb

## 2017-11-07 DIAGNOSIS — J301 Allergic rhinitis due to pollen: Secondary | ICD-10-CM

## 2017-11-07 DIAGNOSIS — Z113 Encounter for screening for infections with a predominantly sexual mode of transmission: Secondary | ICD-10-CM | POA: Diagnosis not present

## 2017-11-07 MED ORDER — LORATADINE 10 MG PO TABS: 10.0000 mg | ORAL_TABLET | Freq: Every day | ORAL | 3 refills | Status: DC

## 2017-11-07 MED ORDER — FLUTICASONE PROPIONATE 50 MCG/ACT NA SUSP: 1.0000 | Freq: Every day | NASAL | 3 refills | Status: DC

## 2017-11-07 MED FILL — FLUTICASONE PROP 50 MCG SPR: 50 | 90 days supply | Qty: 48 | Fill #0

## 2017-11-07 MED FILL — LORATADINE 10 MG TABLET: 10 | 100 days supply | Qty: 100 | Fill #0

## 2017-11-07 NOTE — Patient Instructions (Signed)
For std screening will get urine ancillary studies(for gonorrhea, chlamydia and trichomonas). Also added hiv screen and rpr blood work.  For allergies refilled your flonase and claritin.  Will up date you on lab work when in.  Follow up as regularly scheduled with pcp or as needed

## 2017-11-07 NOTE — Progress Notes (Signed)
Subjective:    Patient ID: Maurice Hansen, male    DOB: 11/09/1980, 37 y.o.   MRN: [id number]  HPI  Pt is in for some routine screening std.   He states in relationship recently and wants to be checked.   No rash on penis, no dysuria and no discharge from penis.  States just getting out of relationship with male.  Recent mild allergy flares and needs refill of medications claritin and flonase. Needs refill of his meds.   Review of Systems  Constitutional: Negative for chills, fatigue and fever.  HENT: Positive for congestion, postnasal drip and rhinorrhea. Negative for ear pain, sinus pressure and sinus pain.   Eyes: Positive for itching.  Respiratory: Positive for wheezing. Negative for cough and chest tightness.        Mild wheeze last week. But not this week.  Cardiovascular: Negative for chest pain and palpitations.  Gastrointestinal: Negative for abdominal pain.  Genitourinary: Negative for difficulty urinating, dysuria, flank pain, frequency and hematuria.  Musculoskeletal: Negative for back pain, myalgias and neck stiffness.  Skin: Negative for rash.  Hematological: Negative for adenopathy. Does not bruise/bleed easily.  Psychiatric/Behavioral: Negative for behavioral problems and confusion.    Past Medical History:  Diagnosis Date  . Anxiety   . RHINITIS 09/16/2008  . STD (male)    h/o gonorrhea      Social History   Socioeconomic History  . Marital status: Single    Spouse name: Not on file  . Number of children: 1  . Years of education: Not on file  . Highest education level: Not on file  Occupational History  . 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 Financial resource strain: Not on file  . Food insecurity:    Worry: Not on file    Inability: Not on file  . Transportation needs:    Medical: Not on file    Non-medical: Not on file  Tobacco Use  . Smoking status: Former Smoker    Last attempt to quit: 03/21/2011      Years since quitting: 6.6  . Smokeless tobacco: Never Used  Substance and Sexual Activity  . Alcohol use: Yes    Comment: socially   . Drug use: No  . Sexual activity: Not on file  Lifestyle  . Physical activity:    Days per week: Not on file    Minutes per session: Not on file  . Stress: Not on file  Relationships  . Social connections:    Talks on phone: Not on file    Gets together: Not on file    Attends religious service: Not on file    Active member of club or organization: Not on file    Attends meetings of clubs or organizations: Not on file    Relationship status: Not on file  . Intimate partner violence:    Fear of current or ex partner: Not on file    Emotionally abused: Not on file    Physically abused: Not on file    Forced sexual activity: Not on file  Other Topics Concern  . Not on file  Social History Narrative   Originally from [place] . Moved to [place] aprox 2006    Engaged    has 1 child        Past Surgical History:  Procedure Laterality Date  . FOOT SURGERY     Right foot surgery as a child?  Family History  Problem Relation Age of Onset  . Thyroid disease Mother        hyperthyroid  . Heart disease Maternal Uncle   . Stroke Maternal Grandmother        GM and GF  . Diabetes Other        GM, GF  . Hypertension Other        Gparents   . Colon cancer Other        M Marisue [place], M Niece  . Prostate cancer Neg Hx   . Colon cancer Father        early 48s  . Breast cancer Mother     No Known Allergies  Current Outpatient Medications on File Prior to Visit  Medication Sig Dispense Refill  . Eszopiclone 3 MG TABS Take 1 tablet (3 mg total) by mouth at bedtime as needed. 30 tablet 0  . sildenafil (REVATIO) 20 MG tablet TAKE 3 TO 4 TABLETS BY MOUTH AT BEDTIME AS NEEDED 30 tablet 0   No current facility-administered medications on file prior to visit.     BP 118/72   Pulse 66   Temp 97.9 F (36.6 C) (Oral)   Resp 16   Ht 6\' 1"   (1.854 m)   Wt 184 lb 3.2 oz (83.6 kg)   SpO2 100%   BMI 24.30 kg/m       Objective:   Physical Exam  General  Mental Status - Alert. General Appearance - Well groomed. Not in acute distress.  Skin Rashes- No Rashes.  HEENT Head- Normal. Ear Auditory Canal - Left- Normal. Right - Normal.Tympanic Membrane- Left- Normal. Right- Normal. Eye Sclera/Conjunctiva- Left- Normal. Right- Normal. Nose & Sinuses Nasal Mucosa- Left-  Boggy and Congested. Right-  Boggy and  Congested.Bilateral no  maxillary and no  frontal sinus pressure. Mouth & Throat Lips: Upper Lip- Normal: no dryness, cracking, pallor, cyanosis, or vesicular eruption. Lower Lip-Normal: no dryness, cracking, pallor, cyanosis or vesicular eruption. Buccal Mucosa- Bilateral- No Aphthous ulcers. Oropharynx- No Discharge or Erythema. Tonsils: Characteristics- Bilateral- No Erythema or Congestion. Size/Enlargement- Bilateral- No enlargement. Discharge- bilateral-None.  Neck Neck- Supple. No Masses.   Chest and Lung Exam Auscultation: Breath Sounds:-Clear even and unlabored.  Cardiovascular Auscultation:Rythm- Regular, rate and rhythm. Murmurs & Other Heart Sounds:Ausculatation of the heart reveal- No Murmurs.  Lymphatic Head & Neck General Head & Neck Lymphatics: Bilateral: Description- No Localized lymphadenopathy.       Assessment & Plan:  For std screening will get urine ancillary studies(for gonorrhea, chlamydia and trichomonas). Also added hiv screen and rpr blood work.  For allergies refilled your flonase and claritin.  Will up date you on lab work when in.  Follow up as regularly scheduled with pcp or as needed  Pt lungs sounded clear. If wheezing returns then would rx albuterol inhaler.   Esperanza Richters, PA-C

## 2017-11-08 LAB — URINE CYTOLOGY ANCILLARY ONLY
Chlamydia: POSITIVE — AB
Neisseria Gonorrhea: NEGATIVE
Trichomonas: NEGATIVE

## 2017-11-08 LAB — RPR: RPR Ser Ql: NONREACTIVE

## 2017-11-08 LAB — HIV ANTIBODY (ROUTINE TESTING W REFLEX): HIV 1&2 Ab, 4th Generation: NONREACTIVE

## 2017-11-09 ENCOUNTER — Telehealth: Payer: Self-pay | Admitting: Medical

## 2017-11-09 MED ORDER — DOXYCYCLINE HYCLATE 100 MG PO TABS: 100.0000 mg | ORAL_TABLET | Freq: Two times a day (BID) | ORAL | 0 refills | Status: DC

## 2017-11-09 MED FILL — DOXYCYCLINE HYCLATE 100 MG: 100 | 10 days supply | Qty: 20 | Fill #0

## 2017-11-09 NOTE — Telephone Encounter (Signed)
rx doxy sent to pt pharmacy.

## 2017-11-28 NOTE — Telephone Encounter (Signed)
If Pt still having symptoms after treatment- he will need to be seen again.

## 2017-11-28 NOTE — Telephone Encounter (Addendum)
Relation to pt: self Call back number: [phone] Pharmacy: [facility] - [place], [place] - [address] [phone] (Phone) [phone] (Fax)    Reason for call:  Patient completed  doxycycline (VIBRA-TABS) 100 MG tablet and stated he's still experiencing discomfort when urinating, please advise  (patient was seen by Ramon Dredge

## 2017-11-29 ENCOUNTER — Ambulatory Visit: Payer: BC Managed Care – PPO | Admitting: Internal Medicine

## 2017-12-22 ENCOUNTER — Encounter: Payer: Self-pay | Admitting: Internal Medicine

## 2017-12-22 ENCOUNTER — Ambulatory Visit (INDEPENDENT_AMBULATORY_CARE_PROVIDER_SITE_OTHER): Payer: BC Managed Care – PPO | Admitting: Internal Medicine

## 2017-12-22 ENCOUNTER — Other Ambulatory Visit (HOSPITAL_COMMUNITY)
Admission: RE | Admit: 2017-12-22 | Discharge: 2017-12-22 | Disposition: A | Discharge status: Home / Self Care | Payer: BC Managed Care – PPO | Source: Ambulatory Visit | Attending: Internal Medicine | Admitting: Internal Medicine

## 2017-12-22 VITALS — BP 112/68 | HR 73 | Temp 98.0°F | Resp 16 | Ht 73.0 in | Wt 177.0 lb

## 2017-12-22 DIAGNOSIS — Z7251 High risk heterosexual behavior: Secondary | ICD-10-CM | POA: Insufficient documentation

## 2017-12-22 DIAGNOSIS — Z0000 Encounter for general adult medical examination without abnormal findings: Secondary | ICD-10-CM

## 2017-12-22 LAB — CBC
HCT: 42.5 % (ref 39.0–52.0)
HEMOGLOBIN: 14.7 g/dL (ref 13.0–17.0)
MCHC: 34.6 g/dL (ref 30.0–36.0)
MCV: 91.4 fl (ref 78.0–100.0)
PLATELETS: 213 10*3/uL (ref 150.0–400.0)
RBC: 4.65 Mil/uL (ref 4.22–5.81)
RDW: 13 % (ref 11.5–15.5)
WBC: 3.3 10*3/uL — ABNORMAL LOW (ref 4.0–10.5)

## 2017-12-22 LAB — LIPID PANEL
CHOL/HDL RATIO: 4
CHOLESTEROL: 193 mg/dL (ref 0–200)
HDL: 43 mg/dL (ref >39.00)
LDL Cholesterol: 135 mg/dL — ABNORMAL HIGH (ref 0–99)
NonHDL: 149.64
Triglycerides: 72 mg/dL (ref 0.0–149.0)
VLDL: 14.4 mg/dL (ref 0.0–40.0)

## 2017-12-22 LAB — COMPREHENSIVE METABOLIC PANEL
ALBUMIN: 4.2 g/dL (ref 3.5–5.2)
ALK PHOS: 70 U/L (ref 39–117)
ALT: 24 U/L (ref 0–53)
AST: 26 U/L (ref 0–37)
BILIRUBIN TOTAL: 0.8 mg/dL (ref 0.2–1.2)
BUN: 16 mg/dL (ref 6–23)
CO2: 28 meq/L (ref 19–32)
CREATININE: 1.08 mg/dL (ref 0.40–1.50)
Calcium: 9.6 mg/dL (ref 8.4–10.5)
Chloride: 104 mEq/L (ref 96–112)
GFR: 98.45 mL/min (ref >60.00)
Glucose, Bld: 88 mg/dL (ref 70–99)
Potassium: 4.2 mEq/L (ref 3.5–5.1)
Sodium: 139 mEq/L (ref 135–145)
TOTAL PROTEIN: 7.1 g/dL (ref 6.0–8.3)

## 2017-12-22 LAB — TSH: TSH: 0.58 u[IU]/mL (ref 0.35–4.50)

## 2017-12-22 MED ORDER — SILDENAFIL CITRATE 20 MG PO TABS: ORAL_TABLET | ORAL | 3 refills | Status: DC

## 2017-12-22 NOTE — Patient Instructions (Signed)
GO TO THE LAB : Get the blood work     GO TO THE FRONT DESK Schedule your next appointment for a physical exam     Safe Sex Practicing safe sex means taking steps before and during sex to reduce your risk of:  Getting an STD (sexually transmitted disease).  Giving your partner an STD.  Unwanted pregnancy.  How can I practice safe sex?  To practice safe sex:  Limit your sexual partners to only one partner who is having sex with only you.  Avoid using alcohol and recreational drugs before having sex. These substances can affect your judgment.  Before having sex with a new partner: ? Talk to your partner about past partners, past STDs, and drug use. ? You and your partner should be screened for STDs and discuss the results with each other.  Check your body regularly for sores, blisters, rashes, or unusual discharge. If you notice any of these problems, visit your health care provider.  If you have symptoms of an infection or you are being treated for an STD, avoid sexual contact.  While having sex, use a condom. Make sure to: ? Use a condom every time you have vaginal, oral, or anal sex. Both females and males should wear condoms during oral sex. ? Keep condoms in place from the beginning to the end of sexual activity. ? Use a latex condom, if possible. Latex condoms offer the best protection. ? Use only water-based lubricants or oils to lubricate a condom. Using petroleum-based lubricants or oils will weaken the condom and increase the chance that it will break.  See your health care provider for regular screenings, exams, and tests for STDs.  Talk with your health care provider about the form of birth control (contraception) that is best for you.  Get vaccinated against hepatitis B and human papillomavirus (HPV).  If you are at risk of being infected with HIV (human immunodeficiency virus), talk with your health care provider about taking a prescription medicine to prevent  HIV infection. You are considered at risk for HIV if: ? You are a man who has sex with other men. ? You are a heterosexual man or woman who is sexually active with more than one partner. ? You take drugs by injection. ? You are sexually active with a partner who has HIV.  This information is not intended to replace advice given to you by your health care provider. Make sure you discuss any questions you have with your health care provider. Document Released: 08/12/2004 Document Revised: 11/19/2015 Document Reviewed: 05/25/2015 Elsevier Interactive Patient Education  [employer]2018 Elsevier Inc.

## 2017-12-22 NOTE — Assessment & Plan Note (Addendum)
-   Td 2014  -(+)  FH colon cancer, father, in his 7770s -Has a healthy lifestyle -Labs: CMP, FLP, CBC, TSH. - pt education: safe sex

## 2017-12-22 NOTE — Progress Notes (Signed)
Subjective:    Patient ID: Maurice Hansen, male    DOB: [birth year], 37 y.o.   MRN: [id number]  DOS:  12/22/2017 Type of visit - description : cpx Interval history: States he is feeling great , no concerns.   Review of Systems Was treated for chlamydia, completely asymptomatic now  Other than above, a 14 point review of systems is negative      Past Medical History:  Diagnosis Date  . Anxiety   . RHINITIS 09/16/2008  . STD (male)    h/o gonorrhea     Past Surgical History:  Procedure Laterality Date  . FOOT SURGERY     Right foot surgery as a child?    Social History   Socioeconomic History  . Marital status: Single    Spouse name: Not on file  . Number of children: 1  . Years of education: Not on file  . Highest education level: Not on file  Occupational History  . Occupation: maintenance tech @ [employer]    Comment:    Social Needs  . Financial resource strain: Not on file  . Food insecurity:    Worry: Not on file    Inability: Not on file  . Transportation needs:    Medical: Not on file    Non-medical: Not on file  Tobacco Use  . Smoking status: Former Smoker    Last attempt to quit: 03/21/2011    Years since quitting: 6.7  . Smokeless tobacco: Never Used  . Tobacco comment: occ cigar  Substance and Sexual Activity  . Alcohol use: Yes    Comment: socially   . Drug use: No  . Sexual activity: Not on file  Lifestyle  . Physical activity:    Days per week: Not on file    Minutes per session: Not on file  . Stress: Not on file  Relationships  . Social connections:    Talks on phone: Not on file    Gets together: Not on file    Attends religious service: Not on file    Active member of club or organization: Not on file    Attends meetings of clubs or organizations: Not on file    Relationship status: Not on file  . Intimate partner violence:    Fear of current or ex partner: Not on file    Emotionally abused: Not on file    Physically abused: Not on  file    Forced sexual activity: Not on file  Other Topics Concern  . Not on file  Social History Narrative   Originally from [place] . Moved to [place] aprox 2006     child 2014    Family History  Problem Relation Age of Onset  . Thyroid disease Mother        hyperthyroid  . Breast cancer Mother   . Heart disease Maternal Uncle   . Stroke Maternal Grandmother        GM and GF  . Diabetes Other        GM, GF  . Hypertension Other        Gparents   . Colon cancer Other        M Marisue [place], M Niece  . Colon cancer Father 82  . Prostate cancer Neg Hx      Allergies as of 12/22/2017   No Known Allergies     Medication List        Accurate as of 12/22/17 11:59 PM. Always  use your most recent med list.          Eszopiclone 3 MG Tabs Take 1 tablet (3 mg total) by mouth at bedtime as needed.   fluticasone 50 MCG/ACT nasal spray Commonly known as:  FLONASE Place 1-2 sprays into both nostrils daily.   loratadine 10 MG tablet Commonly known as:  CLARITIN Take 1 tablet (10 mg total) by mouth daily.   sildenafil 20 MG tablet Commonly known as:  REVATIO TAKE 3 TO 4 TABLETS BY MOUTH AT BEDTIME AS NEEDED          Objective:   Physical Exam BP 112/68 (BP Location: Right Arm, Patient Position: Sitting, Cuff Size: Small)   Pulse 73   Temp 98 F (36.7 C) (Oral)   Resp 16   Ht 6\' 1"  (1.854 m)   Wt 177 lb (80.3 kg)   SpO2 100%   BMI 23.35 kg/m  General:   Well developed, well nourished . NAD.  Neck: No  thyromegaly  HEENT:  Normocephalic . Face symmetric, atraumatic Lungs:  CTA B Normal respiratory effort, no intercostal retractions, no accessory muscle use. Heart: RRR,  no murmur.  No pretibial edema bilaterally  Abdomen:  Not distended, soft, non-tender. No rebound or rigidity.   Skin: Exposed areas without rash. Not pale. Not jaundice Neurologic:  alert & oriented X3.  Speech normal, gait appropriate for age and unassisted Strength symmetric and appropriate  for age.  Psych: Cognition and judgment appear intact.  Cooperative with normal attention span and concentration.  Behavior appropriate. No anxious or depressed appearing.     Assessment & Plan:    Assessment  Anxiety, insomnia  ED H/o gonorrhea, h/o chlamydia 4/19   Plan: Anxiety, insomnia: Not an issue History of ED: Refill Viagra Recent chlamydia: Was treated with antibiotics, RPR and HIV negative, he is asymptomatic, no further unprotected sex.  Safe sex discussed.  Pt is somewhat concerned about recurrence or persistent infection, will check on G&C RTC 1 year

## 2017-12-23 LAB — URINE CYTOLOGY ANCILLARY ONLY
Chlamydia: NEGATIVE
NEISSERIA GONORRHEA: NEGATIVE

## 2017-12-23 NOTE — Assessment & Plan Note (Signed)
Anxiety, insomnia: Not an issue History of ED: Refill Viagra Recent chlamydia: Was treated with antibiotics, RPR and HIV negative, he is asymptomatic, no further unprotected sex.  Safe sex discussed.  Pt is somewhat concerned about recurrence or persistent infection, will check on G&C RTC 1 year

## 2017-12-28 ENCOUNTER — Telehealth: Payer: Self-pay | Admitting: Internal Medicine

## 2017-12-28 MED FILL — SILDENAFIL 20 MG TABLET: 20 | 7 days supply | Qty: 30 | Fill #0

## 2017-12-28 NOTE — Telephone Encounter (Signed)
Copied from CRM [phone]#[id number]. Topic: Quick Communication - See Telephone Encounter >> Dec 28, 2017  4:43 PM Arlyss Gandyichardson, Jarome Trull N, NT wrote: CRM for notification. See Telephone encounter for: 12/28/17. Pt would like call to go over his lab results.

## 2017-12-28 NOTE — Telephone Encounter (Signed)
See below.      Viewed by Ramond Craverodney J Essner on 12/22/2017 6:45 PM  Written by Wanda PlumpPaz, Jose E, MD on 12/22/2017 6:13 PM  Your cholesterol remains very good, all other tests are normal. Great good results.

## 2018-04-24 ENCOUNTER — Telehealth: Payer: Self-pay | Admitting: Internal Medicine

## 2018-04-24 MED ORDER — LORATADINE 10 MG PO TABS: 10.0000 mg | ORAL_TABLET | Freq: Every day | ORAL | 2 refills | Status: DC

## 2018-04-24 MED ORDER — SILDENAFIL CITRATE 20 MG PO TABS: ORAL_TABLET | ORAL | 2 refills | Status: DC

## 2018-04-24 MED ORDER — FLUTICASONE PROPIONATE 50 MCG/ACT NA SUSP: 1.0000 | Freq: Every day | NASAL | 2 refills | Status: DC

## 2018-04-24 MED ORDER — ESZOPICLONE 3 MG PO TABS: 3.0000 mg | ORAL_TABLET | Freq: Every evening | ORAL | 1 refills | Status: DC | PRN

## 2018-04-24 NOTE — Telephone Encounter (Signed)
Sent!

## 2018-04-24 NOTE — Telephone Encounter (Signed)
Pt is requesting refill on Lunesta 3mg .   Last OV: 12/22/2017 Last Fill: 01/14/2017 #30 and 0RF UDS: None

## 2018-04-24 NOTE — Telephone Encounter (Signed)
Call to patient- he wants to change Rx to [facility]- which is closer to him. He is also requesting a refill on his sleep medication- he did not address that at is appointment- but states he needs it.

## 2018-04-24 NOTE — Telephone Encounter (Signed)
Copied from CRM [id number]. Topic: Quick Communication - Rx Refill/Question >> Apr 24, 2018  1:11 PM Arlyss Gandy, NT wrote: Medication: loratadine (CLARITIN) 10 MG tablet   Has the patient contacted their pharmacy? Yes.   (Agent: If no, request that the patient contact the pharmacy for the refill.) (Agent: If yes, when and what did the pharmacy advise?)  Preferred Pharmacy (with phone number or street name): [id number] [place], [place] - [address] AT [facility] [address] [phone] (Phone) [phone] (Fax)    Agent: Please be advised that RX refills may take up to 3 business days. We ask that you follow-up with your pharmacy.

## 2018-04-24 NOTE — Telephone Encounter (Signed)
Requested medication (s) are due for refill today -yes  Requested medication (s) are on the active medication list -yes  Future visit scheduled -yes  Patient is requesting this medication be filled- did not discuss at last appointment   Requested Prescriptions  Pending Prescriptions Disp Refills   Eszopiclone 3 MG TABS 30 tablet 0    Sig: Take 1 tablet (3 mg total) by mouth at bedtime as needed.     Not Delegated - Psychiatry:  Anxiolytics/Hypnotics Failed - 04/24/2018  1:32 PM      Failed - This refill cannot be delegated      Failed - Urine Drug Screen completed in last 360 days.      Passed - Valid encounter within last 6 months    Recent Outpatient Visits          4 months ago Annual physical exam   Holiday representative at [facility] [place], [place] E, MD   5 months ago Screening examination for STD (sexually transmitted disease)   Holiday representative at [facility], [place], [place]   2 years ago Annual physical exam   Holiday representative at [facility], [place] Rod, [place]   3 years ago Insomnia   Holiday representative at [facility], [place] E, [place]   4 years ago Anxiety state, unspecified   Holiday representative at [facility], [place] Rod, MD      Future Appointments            In 8 months Wanda Plump, MD [employer] [facility] at [facility], [place]         Signed Prescriptions Disp Refills   fluticasone (FLONASE) 50 MCG/ACT nasal spray 48 g 2    Sig: Place 1-2 sprays into both nostrils daily.     Ear, Nose, and Throat: Nasal Preparations - Corticosteroids Passed - 04/24/2018  1:34 PM      Passed - Valid encounter within last 12 months    Recent Outpatient Visits          4 months ago Annual physical exam   Holiday representative at [facility] [place], [place] E, MD   5 months ago Screening examination for STD (sexually transmitted disease)   Ecologist at [facility], [place], [place]   2 years ago Annual physical exam   Holiday representative at [facility], [place] Rod, [place]   3 years ago Insomnia   Holiday representative at [facility], [place] E, MD   4 years ago Anxiety state, unspecified   Holiday representative at [facility], [place] Rod, MD      Future Appointments            In 8 months Wanda Plump, MD [facility] at [facility], [place]          loratadine (CLARITIN) 10 MG tablet 90 tablet 2    Sig: Take 1 tablet (10 mg total) by mouth daily.     Ear, Nose, and Throat:  Antihistamines Passed - 04/24/2018  1:32 PM      Passed - Valid encounter within last 12 months    Recent Outpatient Visits          4 months ago Annual physical exam   Holiday representative at [facility]  [place], [place] Rod, [place]   5 months ago Screening examination for STD (sexually transmitted disease)   Holiday representative at [facility], [place], [place]   2 years ago Annual physical exam   Holiday representative at [facility], [place] E, MD   3 years ago Insomnia   Holiday representative at [facility], [place] E, MD   4 years ago Anxiety state, unspecified   Holiday representative at [facility], [place] Rod, MD      Future Appointments            In 8 months Wanda Plump, MD [facility] at [facility], [place]          sildenafil (REVATIO) 20 MG tablet 30 tablet 2    Sig: TAKE 3 TO 4 TABLETS BY MOUTH AT BEDTIME AS NEEDED     Urology: Erectile Dysfunction Agents Passed - 04/24/2018  1:32 PM      Passed - Last BP in normal range    BP Readings from Last 1 Encounters:  12/22/17 112/68         Passed - Valid encounter within last 12 months    Recent Outpatient Visits          4 months ago Annual physical exam   Ecologist at [facility] [place], [place] E, MD   5 months ago Screening examination for STD (sexually transmitted disease)   Holiday representative at [facility], [place], [place]   2 years ago Annual physical exam   Holiday representative at [facility], [place] E, MD   3 years ago Insomnia   Holiday representative at [facility], [place] E, MD   4 years ago Anxiety state, unspecified   Holiday representative at [facility], [place]ol[place] Rod, MD      Future Appointments            In 8 months Paz, Nolon Rod, MD [employer] [facility] at [facility], [place]

## 2018-12-25 ENCOUNTER — Encounter: Payer: Self-pay | Admitting: Internal Medicine

## 2018-12-25 ENCOUNTER — Ambulatory Visit (INDEPENDENT_AMBULATORY_CARE_PROVIDER_SITE_OTHER): Payer: BC Managed Care – PPO | Admitting: Internal Medicine

## 2018-12-25 ENCOUNTER — Other Ambulatory Visit: Payer: Self-pay

## 2018-12-25 DIAGNOSIS — Z0000 Encounter for general adult medical examination without abnormal findings: Secondary | ICD-10-CM

## 2018-12-25 DIAGNOSIS — Z09 Encounter for follow-up examination after completed treatment for conditions other than malignant neoplasm: Secondary | ICD-10-CM

## 2018-12-25 NOTE — Progress Notes (Signed)
Subjective:    Patient ID: Maurice Hansen, male    DOB: 1981-04-12, 38 y.o.   MRN: [id number]  DOS:  12/25/2018 Type of visit - description: Virtual Visit via Video Note  I connected with@ on 12/25/18 at  9:00 AM EDT by a video enabled telemedicine application and verified that I am speaking with the correct person using two identifiers.   THIS ENCOUNTER IS A VIRTUAL VISIT DUE TO COVID-19 - PATIENT WAS NOT SEEN IN THE OFFICE. PATIENT HAS CONSENTED TO VIRTUAL VISIT / TELEMEDICINE VISIT   Location of patient: home  Location of provider: office  I discussed the limitations of evaluation and management by telemedicine and the availability of in person appointments. The patient expressed understanding and agreed to proceed.  History of Present Illness: CPX Since the last office visit he is doing well. He is back working after being off for several weeks.  Reports good compliance with COVID-19 precautions.    Review of Systems Reports he is doing well emotionally, specifically denies anxiety or depression.  No LUTS.  Other than above, a 14 point review of systems is negative      Past Medical History:  Diagnosis Date  . Anxiety   . RHINITIS 09/16/2008  . STD (male)    h/o gonorrhea     Past Surgical History:  Procedure Laterality Date  . FOOT SURGERY     Right foot surgery as a child?    Social History   Socioeconomic History  . Marital status: Single    Spouse name: Not on file  . Number of children: 1  . Years of education: Not on file  . Highest education level: Not on file  Occupational History  . Occupation: maintenance tech @ [employer]    Comment:    Social Needs  . Financial resource strain: Not on file  . Food insecurity:    Worry: Not on file    Inability: Not on file  . Transportation needs:    Medical: Not on file    Non-medical: Not on file  Tobacco Use  . Smoking status: Former Smoker    Last attempt to quit: 03/21/2011    Years since quitting: 7.7  .  Smokeless tobacco: Never Used  . Tobacco comment: occ cigar  Substance and Sexual Activity  . Alcohol use: Yes    Comment: socially   . Drug use: No  . Sexual activity: Not on file  Lifestyle  . Physical activity:    Days per week: Not on file    Minutes per session: Not on file  . Stress: Not on file  Relationships  . Social connections:    Talks on phone: Not on file    Gets together: Not on file    Attends religious service: Not on file    Active member of club or organization: Not on file    Attends meetings of clubs or organizations: Not on file    Relationship status: Not on file  . Intimate partner violence:    Fear of current or ex partner: Not on file    Emotionally abused: Not on file    Physically abused: Not on file    Forced sexual activity: Not on file  Other Topics Concern  . Not on file  Social History Narrative   Originally from [place] .    Moved to [place] aprox 2006    Lives by himself, child 2014      Family History  Problem Relation Age of Onset  . Thyroid disease Mother        hyperthyroid  . Breast cancer Mother   . Heart disease Maternal Uncle   . Stroke Maternal Grandmother        GM and GF  . Diabetes Other        GM, GF  . Hypertension Other        Gparents   . Colon cancer Other        M Marisue BrooklynGreat Uncle, M Niece  . Colon cancer Father [id number]  . Prostate cancer Neg Hx      Allergies as of 12/25/2018   No Known Allergies     Medication List       Accurate as of December 25, 2018  2:10 PM. If you have any questions, ask your nurse or doctor.        Eszopiclone 3 MG Tabs Take 1 tablet (3 mg total) by mouth at bedtime as needed.   fluticasone 50 MCG/ACT nasal spray Commonly known as:  FLONASE Place 1-2 sprays into both nostrils daily.   loratadine 10 MG tablet Commonly known as:  CLARITIN Take 1 tablet (10 mg total) by mouth daily.   sildenafil 20 MG tablet Commonly known as:  REVATIO TAKE 3 TO 4 TABLETS BY MOUTH AT BEDTIME AS NEEDED            Objective:   Physical Exam There were no vitals taken for this visit. This is a virtual video visit, he is alert oriented x3, no apparent distress    Assessment    Assessment  Anxiety, insomnia  ED H/o gonorrhea, h/o chlamydia 4/19   Plan: Here for a CPX, virtually. Doing well.  No concerns RTC 1 year    I discussed the assessment and treatment plan with the patient. The patient was provided an opportunity to ask questions and all were answered. The patient agreed with the plan and demonstrated an understanding of the instructions.   The patient was advised to call back or seek an in-person evaluation if the symptoms worsen or if the condition fails to improve as anticipated.

## 2018-12-25 NOTE — Assessment & Plan Note (Signed)
Here for a CPX, virtually. Doing well.  No concerns RTC 1 year

## 2018-12-25 NOTE — Assessment & Plan Note (Signed)
-   Td 2014  -(+)  FH colon cancer, father, in his 24s -Labs: CMP, FLP, CBC  - pt education: Encouraged to continue being active, eat healthy.  Also recommend safe sex

## 2019-01-11 ENCOUNTER — Other Ambulatory Visit: Payer: Self-pay

## 2019-01-11 ENCOUNTER — Other Ambulatory Visit (INDEPENDENT_AMBULATORY_CARE_PROVIDER_SITE_OTHER): Payer: BC Managed Care – PPO

## 2019-01-11 DIAGNOSIS — Z0000 Encounter for general adult medical examination without abnormal findings: Secondary | ICD-10-CM

## 2019-01-11 LAB — LIPID PANEL
Cholesterol: 211 mg/dL — ABNORMAL HIGH (ref 0–200)
HDL: 37.9 mg/dL — ABNORMAL LOW (ref >39.00)
LDL Cholesterol: 150 mg/dL — ABNORMAL HIGH (ref 0–99)
NonHDL: 172.7
Total CHOL/HDL Ratio: 6
Triglycerides: 114 mg/dL (ref 0.0–149.0)
VLDL: 22.8 mg/dL (ref 0.0–40.0)

## 2019-01-11 LAB — COMPREHENSIVE METABOLIC PANEL
ALT: 26 U/L (ref 0–53)
AST: 25 U/L (ref 0–37)
Albumin: 4.3 g/dL (ref 3.5–5.2)
Alkaline Phosphatase: 61 U/L (ref 39–117)
BUN: 17 mg/dL (ref 6–23)
CO2: 28 mEq/L (ref 19–32)
Calcium: 9 mg/dL (ref 8.4–10.5)
Chloride: 103 mEq/L (ref 96–112)
Creatinine, Ser: 1.16 mg/dL (ref 0.40–1.50)
GFR: 84.83 mL/min (ref >60.00)
Glucose, Bld: 84 mg/dL (ref 70–99)
Potassium: 4.1 mEq/L (ref 3.5–5.1)
Sodium: 139 mEq/L (ref 135–145)
Total Bilirubin: 0.8 mg/dL (ref 0.2–1.2)
Total Protein: 7.1 g/dL (ref 6.0–8.3)

## 2019-01-11 LAB — CBC WITH DIFFERENTIAL/PLATELET
Basophils Absolute: 0 10*3/uL (ref 0.0–0.1)
Basophils Relative: 0.8 % (ref 0.0–3.0)
Eosinophils Absolute: 0.1 10*3/uL (ref 0.0–0.7)
Eosinophils Relative: 3.2 % (ref 0.0–5.0)
HCT: 43.5 % (ref 39.0–52.0)
Hemoglobin: 14.8 g/dL (ref 13.0–17.0)
Lymphocytes Relative: 60.2 % — ABNORMAL HIGH (ref 12.0–46.0)
Lymphs Abs: 2.4 10*3/uL (ref 0.7–4.0)
MCHC: 34.1 g/dL (ref 30.0–36.0)
MCV: 92.4 fl (ref 78.0–100.0)
Monocytes Absolute: 0.4 10*3/uL (ref 0.1–1.0)
Monocytes Relative: 9.1 % (ref 3.0–12.0)
Neutro Abs: 1.1 10*3/uL — ABNORMAL LOW (ref 1.4–7.7)
Neutrophils Relative %: 26.7 % — ABNORMAL LOW (ref 43.0–77.0)
Platelets: 231 10*3/uL (ref 150.0–400.0)
RBC: 4.7 Mil/uL (ref 4.22–5.81)
RDW: 12.5 % (ref 11.5–15.5)
WBC: 4 10*3/uL (ref 4.0–10.5)

## 2019-04-27 ENCOUNTER — Other Ambulatory Visit: Payer: Self-pay

## 2019-04-27 ENCOUNTER — Ambulatory Visit (INDEPENDENT_AMBULATORY_CARE_PROVIDER_SITE_OTHER): Payer: BC Managed Care – PPO

## 2019-04-27 DIAGNOSIS — Z23 Encounter for immunization: Secondary | ICD-10-CM

## 2019-05-31 ENCOUNTER — Other Ambulatory Visit: Payer: Self-pay | Admitting: Internal Medicine

## 2019-06-05 ENCOUNTER — Telehealth: Payer: Self-pay

## 2019-06-05 NOTE — Telephone Encounter (Signed)
PA initiated via Covermymeds; KEY: [id number]. Awaiting determination.

## 2019-06-05 NOTE — Telephone Encounter (Signed)
PA denied. Coverage for this medication only for those w/ dx of pulmonary arterial hypertension or secondary Raynauds phenomenon.

## 2020-09-25 ENCOUNTER — Encounter: Payer: Self-pay | Admitting: Internal Medicine

## 2021-01-01 ENCOUNTER — Ambulatory Visit
Admission: EM | Admit: 2021-01-01 | Discharge: 2021-01-01 | Disposition: A | Discharge status: Home / Self Care | Payer: No Typology Code available for payment source | Attending: Emergency Medicine | Admitting: Emergency Medicine

## 2021-01-01 ENCOUNTER — Ambulatory Visit (INDEPENDENT_AMBULATORY_CARE_PROVIDER_SITE_OTHER): Payer: No Typology Code available for payment source

## 2021-01-01 ENCOUNTER — Other Ambulatory Visit: Payer: Self-pay

## 2021-01-01 DIAGNOSIS — M25572 Pain in left ankle and joints of left foot: Secondary | ICD-10-CM

## 2021-01-01 DIAGNOSIS — S93401A Sprain of unspecified ligament of right ankle, initial encounter: Secondary | ICD-10-CM | POA: Diagnosis not present

## 2021-01-01 MED ORDER — IBUPROFEN 800 MG PO TABS: 800.0000 mg | ORAL_TABLET | Freq: Three times a day (TID) | ORAL | 0 refills | Status: DC

## 2021-01-01 NOTE — ED Provider Notes (Signed)
[facility]    CSN: [id number] Arrival date & time: 01/01/21  1620      History   Chief Complaint Chief Complaint  Patient presents with   Ankle Pain    Left     HPI ZACKERY BRINE is a 40 y.o. male presenting today for evaluation of ankle pain and swelling.  Reports left ankle injury approximately 1 month ago.  Reports that he excellently inverted his left ankle off of a curb.  He initially rested iced elevated, symptoms slightly improved, but continues to have discomfort and swelling 1 month later.  He ports that he is on his feet for extended periods of time during the day.  He denies any numbness or tingling.  Denies remote history of any prior fractures.  HPI  Past Medical History:  Diagnosis Date   Anxiety    RHINITIS 09/16/2008   STD (male)    h/o gonorrhea     Patient Active Problem List   Diagnosis Date Noted   PCP NOTES >>>>>>>>>>>>>> 12/09/2015   Insomnia 05/10/2014   Anxiety 09/10/2013   Annual physical exam 05/21/2011   TRANSAMINASES, SERUM, ELEVATED 09/23/2009   RHINITIS 09/16/2008    Past Surgical History:  Procedure Laterality Date   FOOT SURGERY     Right foot surgery as a child?       Home Medications    Prior to Admission medications   Medication Sig Start Date End Date Taking? Authorizing Provider  ibuprofen (ADVIL) 800 MG tablet Take 1 tablet (800 mg total) by mouth 3 (three) times daily. 01/01/21  Yes Jebadiah Imperato C, PA-C  Eszopiclone 3 MG TABS Take 1 tablet (3 mg total) by mouth at bedtime as needed. 04/24/18   Wanda Plump, MD  fluticasone [facility]) 50 MCG/ACT nasal spray Place 1-2 sprays into both nostrils daily. 04/24/18   Saguier, Ramon Dredge, PA-C  loratadine (CLARITIN) 10 MG tablet TAKE 1 TABLET BY MOUTH EVERY DAY 06/01/19   Wanda Plump, MD  sildenafil (REVATIO) 20 MG tablet TAKE 3 TO 4 TABLETS AT BEDTIME AS NEEDED 06/01/19   Wanda Plump, MD    Family History Family History  Problem Relation Age of Onset   Thyroid  disease Mother        hyperthyroid   Breast cancer Mother    Heart disease Maternal Uncle    Stroke Maternal Grandmother        GM and GF   Diabetes Other        GM, GF   Hypertension Other        Gparents    Colon cancer Other        M Great Uncle, M Niece   Colon cancer Father 48   Prostate cancer Neg Hx     Social History Social History   Tobacco Use   Smoking status: Former    Pack years: 0.00    Types: Cigarettes    Quit date: 03/21/2011    Years since quitting: 9.7   Smokeless tobacco: Never   Tobacco comments:    occ cigar  Substance Use Topics   Alcohol use: Yes    Comment: socially    Drug use: No     Allergies   Patient has no known allergies.   Review of Systems Review of Systems  Constitutional:  Negative for fatigue and fever.  Eyes:  Negative for redness, itching and visual disturbance.  Respiratory:  Negative for shortness of breath.   Cardiovascular:  Negative  for chest pain and leg swelling.  Gastrointestinal:  Negative for nausea and vomiting.  Musculoskeletal:  Positive for arthralgias and joint swelling. Negative for myalgias.  Skin:  Negative for color change, rash and wound.  Neurological:  Negative for dizziness, syncope, weakness, light-headedness and headaches.    Physical Exam Triage Vital Signs ED Triage Vitals  Enc Vitals Group     BP      Pulse      Resp      Temp      Temp src      SpO2      Weight      Height      Head Circumference      Peak Flow      Pain Score      Pain Loc      Pain Edu?      Excl. in GC?    No data found.  Updated Vital Signs BP (!) 143/81 (BP Location: Left Arm)   Pulse 68   Temp 99.1 F (37.3 C) (Oral)   Resp 18   SpO2 96%   Visual Acuity Right Eye Distance:   Left Eye Distance:   Bilateral Distance:    Right Eye Near:   Left Eye Near:    Bilateral Near:     Physical Exam Vitals and nursing note reviewed.  Constitutional:      Appearance: He is well-developed.      Comments: No acute distress  HENT:     Head: Normocephalic and atraumatic.     Nose: Nose normal.  Eyes:     Conjunctiva/sclera: Conjunctivae normal.  Cardiovascular:     Rate and Rhythm: Normal rate.  Pulmonary:     Effort: Pulmonary effort is normal. No respiratory distress.  Abdominal:     General: There is no distension.  Musculoskeletal:        General: Normal range of motion.     Cervical back: Neck supple.     Comments: Left ankle: Mild swelling noted about lateral malleolus, no overlying erythema or warmth, tender to palpation to distal fibula and lateral malleolus extending slightly posterior towards Achilles, nontender directly over Achilles and appears forearm and intact, nontender medial malleolus, nontender throughout dorsum of foot, dorsalis pedis 2+  Skin:    General: Skin is warm and dry.  Neurological:     Mental Status: He is alert and oriented to person, place, and time.     UC Treatments / Results  Labs (all labs ordered are listed, but only abnormal results are displayed) Labs Reviewed - No data to display  EKG   Radiology DG Ankle Complete Left  Result Date: 01/01/2021 CLINICAL DATA:  Swelling and decreased range of motion. EXAM: LEFT ANKLE COMPLETE - 3+ VIEW COMPARISON:  None FINDINGS: Mild lateral soft tissue swelling. Well corticated os ossific densities adjacent to the tip of the lateral malleolus the compatible with benign ossicles versus sequelae of remote trauma. No signs of acute fracture or dislocation. No radio-opaque foreign body or soft tissue calcifications. IMPRESSION: 1. No acute bone abnormality. 2. Lateral soft tissue swelling. Electronically Signed   By: Signa Kell M.D.   On: 01/01/2021 17:53    Procedures Procedures (including critical care time)  Medications Ordered in UC Medications - No data to display  Initial Impression / Assessment and Plan / UC Course  I have reviewed the triage vital signs and the nursing  notes.  Pertinent labs & imaging results that were available during  my care of the patient were reviewed by me and considered in my medical decision making (see chart for details).     Left ankle sprain versus healing old fracture-x-ray with possible remote avulsion fracture questionable timeframe of this.  Placing an ASO recommending anti-inflammatories gentle range of motion exercises and follow-up with sports medicine.  Discussed strict return precautions. Patient verbalized understanding and is agreeable with plan.  Final Clinical Impressions(s) / UC Diagnoses   Final diagnoses:  Sprain of right ankle, unspecified ligament, initial encounter     Discharge Instructions      Please follow-up with sports medicine if continuing to have pain with ankle/limitations in activity Use anti-inflammatories for pain/swelling. You may take up to 800 mg Ibuprofen every 8 hours with food. You may supplement Ibuprofen with Tylenol [phone] mg every 8 hours.  Ice and elevate Gentle range of motion exercises-see attached Wear ankle brace- ASO ankle brace       ED Prescriptions     Medication Sig Dispense Auth. Provider   ibuprofen (ADVIL) 800 MG tablet Take 1 tablet (800 mg total) by mouth 3 (three) times daily. 21 tablet Sheala Dosh, [place] C, PA-C      PDMP not reviewed this encounter.   Nadyne Gariepy, [place] C, PA-C 01/02/21 1223

## 2021-01-01 NOTE — ED Triage Notes (Signed)
In early May 2022, Pt reports that he was walking and twisted his left ankle causing an onset of pain, swelling, weakness and "burning" sensation. Has soaked it epsom salt and taken OTC meds with minimal pain relief. No falls or injuries reported. Pt works on his feet and is concerned for further damage being that his sxs have not resolved.

## 2021-01-01 NOTE — Discharge Instructions (Addendum)
Please follow-up with sports medicine if continuing to have pain with ankle/limitations in activity Use anti-inflammatories for pain/swelling. You may take up to 800 mg Ibuprofen every 8 hours with food. You may supplement Ibuprofen with Tylenol [phone] mg every 8 hours.  Ice and elevate Gentle range of motion exercises-see attached Wear ankle brace- ASO ankle brace

## 2021-03-04 ENCOUNTER — Encounter: Payer: BC Managed Care – PPO | Admitting: Internal Medicine

## 2021-03-11 DIAGNOSIS — Z3009 Encounter for other general counseling and advice on contraception: Secondary | ICD-10-CM | POA: Diagnosis not present

## 2021-03-11 DIAGNOSIS — Z1388 Encounter for screening for disorder due to exposure to contaminants: Secondary | ICD-10-CM | POA: Diagnosis not present

## 2021-03-11 DIAGNOSIS — Z0389 Encounter for observation for other suspected diseases and conditions ruled out: Secondary | ICD-10-CM | POA: Diagnosis not present

## 2021-06-24 ENCOUNTER — Encounter: Payer: Self-pay | Admitting: Internal Medicine

## 2021-08-06 ENCOUNTER — Other Ambulatory Visit (HOSPITAL_BASED_OUTPATIENT_CLINIC_OR_DEPARTMENT_OTHER): Payer: Self-pay

## 2021-08-06 ENCOUNTER — Encounter: Payer: Self-pay | Admitting: Internal Medicine

## 2021-08-06 ENCOUNTER — Ambulatory Visit (INDEPENDENT_AMBULATORY_CARE_PROVIDER_SITE_OTHER): Payer: BC Managed Care – PPO | Admitting: Internal Medicine

## 2021-08-06 VITALS — BP 122/84 | HR 76 | Temp 98.3°F | Resp 16 | Ht 73.0 in | Wt 209.5 lb

## 2021-08-06 DIAGNOSIS — Z1159 Encounter for screening for other viral diseases: Secondary | ICD-10-CM | POA: Diagnosis not present

## 2021-08-06 DIAGNOSIS — Z23 Encounter for immunization: Secondary | ICD-10-CM | POA: Diagnosis not present

## 2021-08-06 DIAGNOSIS — Z0000 Encounter for general adult medical examination without abnormal findings: Secondary | ICD-10-CM | POA: Diagnosis not present

## 2021-08-06 DIAGNOSIS — Z113 Encounter for screening for infections with a predominantly sexual mode of transmission: Secondary | ICD-10-CM

## 2021-08-06 LAB — CBC WITH DIFFERENTIAL/PLATELET
Basophils Absolute: 0 10*3/uL (ref 0.0–0.1)
Basophils Relative: 1 % (ref 0.0–3.0)
Eosinophils Absolute: 0.2 10*3/uL (ref 0.0–0.7)
Eosinophils Relative: 4.4 % (ref 0.0–5.0)
HCT: 42.3 % (ref 39.0–52.0)
Hemoglobin: 14 g/dL (ref 13.0–17.0)
Lymphocytes Relative: 51.7 % — ABNORMAL HIGH (ref 12.0–46.0)
Lymphs Abs: 1.8 10*3/uL (ref 0.7–4.0)
MCHC: 33.1 g/dL (ref 30.0–36.0)
MCV: 91.9 fl (ref 78.0–100.0)
Monocytes Absolute: 0.3 10*3/uL (ref 0.1–1.0)
Monocytes Relative: 9.6 % (ref 3.0–12.0)
Neutro Abs: 1.2 10*3/uL — ABNORMAL LOW (ref 1.4–7.7)
Neutrophils Relative %: 33.3 % — ABNORMAL LOW (ref 43.0–77.0)
Platelets: 244 10*3/uL (ref 150.0–400.0)
RBC: 4.6 Mil/uL (ref 4.22–5.81)
RDW: 12.9 % (ref 11.5–15.5)
WBC: 3.5 10*3/uL — ABNORMAL LOW (ref 4.0–10.5)

## 2021-08-06 LAB — COMPREHENSIVE METABOLIC PANEL
ALT: 47 U/L (ref 0–53)
AST: 39 U/L — ABNORMAL HIGH (ref 0–37)
Albumin: 4.5 g/dL (ref 3.5–5.2)
Alkaline Phosphatase: 55 U/L (ref 39–117)
BUN: 12 mg/dL (ref 6–23)
CO2: 28 mEq/L (ref 19–32)
Calcium: 9.3 mg/dL (ref 8.4–10.5)
Chloride: 103 mEq/L (ref 96–112)
Creatinine, Ser: 1.21 mg/dL (ref 0.40–1.50)
GFR: 74.36 mL/min (ref >60.00)
Glucose, Bld: 75 mg/dL (ref 70–99)
Potassium: 4.2 mEq/L (ref 3.5–5.1)
Sodium: 139 mEq/L (ref 135–145)
Total Bilirubin: 0.6 mg/dL (ref 0.2–1.2)
Total Protein: 7.3 g/dL (ref 6.0–8.3)

## 2021-08-06 LAB — LIPID PANEL
Cholesterol: 204 mg/dL — ABNORMAL HIGH (ref 0–200)
HDL: 48.7 mg/dL (ref >39.00)
LDL Cholesterol: 142 mg/dL — ABNORMAL HIGH (ref 0–99)
NonHDL: 155.57
Total CHOL/HDL Ratio: 4
Triglycerides: 70 mg/dL (ref 0.0–149.0)
VLDL: 14 mg/dL (ref 0.0–40.0)

## 2021-08-06 LAB — TSH: TSH: 0.98 u[IU]/mL (ref 0.35–5.50)

## 2021-08-06 MED ORDER — LORATADINE 10 MG PO TABS
10.0000 mg | ORAL_TABLET | Freq: Every day | ORAL | 1 refills | Status: DC | PRN
  Filled 2021-08-06: qty 100, 100d supply, fill #0

## 2021-08-06 MED ORDER — SILDENAFIL CITRATE 20 MG PO TABS
ORAL_TABLET | ORAL | 5 refills | Status: DC
  Filled 2021-08-06: qty 30, 8d supply, fill #0
  Filled 2021-12-01: qty 30, 8d supply, fill #1
  Filled 2022-01-08: qty 30, 8d supply, fill #2
  Filled 2022-01-25: qty 30, 8d supply, fill #3
  Filled 2022-02-26: qty 30, 8d supply, fill #4
  Filled 2022-04-08: qty 30, 8d supply, fill #5

## 2021-08-06 NOTE — Patient Instructions (Signed)
° ° °  GO TO THE LAB : Get the blood work     GO TO THE FRONT DESK, PLEASE SCHEDULE YOUR APPOINTMENTS Come back for a physical exam in 1 year    Safe Sex Practicing safe sex means taking steps before and during sex to reduce your risk of: Getting an STI (sexually transmitted infection). Giving your partner an STI. Unwanted or unplanned pregnancy. How to practice safe sex Ways you can practice safe sex  Limit your sexual partners to only one partner who is having sex with only you. Avoid using alcohol and drugs before having sex. Alcohol and drugs can affect your judgment. Before having sex with a new partner: Talk to your partner about past partners, past STIs, and drug use. Get screened for STIs and discuss the results with your partner. Ask your partner to get screened too. Check your body regularly for sores, blisters, rashes, or unusual discharge. If you notice any of these problems, visit your health care provider. Avoid sexual contact if you have symptoms of an infection or you are being treated for an STI. While having sex, use a condom. Make sure to: Use a condom every time you have vaginal, oral, or anal sex. Both females and males should wear condoms during oral sex. Keep condoms in place from the beginning to the end of sexual activity. Use a latex condom, if possible. Latex condoms offer the best protection. Use only water-based lubricants with a condom. Using petroleum-based lubricants or oils will weaken the condom and increase the chance that it will break. Ways your health care provider can help you practice safe sex  See your health care provider for regular screenings, exams, and tests for STIs. Talk with your health care provider about what kind of birth control (contraception) is best for you. Get vaccinated against hepatitis B and human papillomavirus (HPV). If you are at risk of being infected with HIV (human immunodeficiency virus), talk with your health care  provider about taking a prescription medicine to prevent HIV infection. You are at risk for HIV if you: Are a man who has sex with other men. Are sexually active with more than one partner. Take drugs by injection. Have a sex partner who has HIV. Have unprotected sex. Have sex with someone who has sex with both men and women. Have had an STI. Follow these instructions at home: Take over-the-counter and prescription medicines only as told by your health care provider. Keep all follow-up visits. This is important. Where to find more information Centers for Disease Control and Prevention: FootballExhibition.com.br Planned Parenthood: www.plannedparenthood.org Office on [employer] Health: http://hoffman.com/ Summary Practicing safe sex means taking steps before and during sex to reduce your risk getting an STI, giving your partner an STI, and having an unwanted or unplanned pregnancy. Before having sex with a new partner, talk to your partner about past partners, past STIs, and drug use. Use a condom every time you have vaginal, oral, or anal sex. Both females and males should wear condoms during oral sex. Check your body regularly for sores, blisters, rashes, or unusual discharge. If you notice any of these problems, visit your health care provider. See your health care provider for regular screenings, exams, and tests for STIs. This information is not intended to replace advice given to you by your health care provider. Make sure you discuss any questions you have with your health care provider. Document Revised: 12/10/2019 Document Reviewed: 12/10/2019 Elsevier Patient Education  2022 [employer].

## 2021-08-06 NOTE — Assessment & Plan Note (Signed)
-   Td 2014  - covid vax: rec booster  - flu shot today -CCS (+) FH colon cancer, father, in his 64s.  To start screening age 41 -Labs: CMP, FLP, CBC, TSH, HIV, hep C, RPR - pt education: he remains active, plays basketball without any symptoms, exercises regularly.  Recommend safe sex.

## 2021-08-06 NOTE — Assessment & Plan Note (Signed)
Here for a CPX Anxiety, insomnia: Doing well, not taking any medication ED: Sildenafil refilled. Allergies: Claritin refilled. RTC 1 year

## 2021-08-06 NOTE — Progress Notes (Signed)
Subjective:    Patient ID: Maurice Hansen, male    DOB: Apr 06, 1981, 41 y.o.   MRN: [id number]  DOS:  08/06/2021 Type of visit - description: here for CPX  Since the last office visit is doing well and has no concern  Review of Systems     A 14 point review of systems is negative    Past Medical History:  Diagnosis Date   Anxiety    RHINITIS 09/16/2008   STD (male)    h/o gonorrhea     Past Surgical History:  Procedure Laterality Date   FOOT SURGERY     Right foot surgery as a child?   Social History   Socioeconomic History   Marital status: Single    Spouse name: Not on file   Number of children: 1   Years of education: Not on file   Highest education level: Not on file  Occupational History   Occupation: property mngmt , to switch to a insurance Co soon    Comment:    Tobacco Use   Smoking status: Former    Types: Cigarettes    Quit date: 03/21/2011    Years since quitting: 10.3   Smokeless tobacco: Never   Tobacco comments:    occ cigar  Substance and Sexual Activity   Alcohol use: Yes    Comment: socially    Drug use: No   Sexual activity: Not on file  Other Topics Concern   Not on file  Social History Narrative   Originally from [place] .    Moved to [place] aprox 2006    Lives by himself, child 2014    Social Determinants of Health   Financial Resource Strain: Not on file  Food Insecurity: Not on file  Transportation Needs: Not on file  Physical Activity: Not on file  Stress: Not on file  Social Connections: Not on file  Intimate Partner Violence: Not on file     Current Outpatient Medications  Medication Instructions   loratadine (CLARITIN) 10 mg, Oral, Daily PRN   Multiple Vitamin (MULTIVITAMIN WITH MINERALS) TABS tablet 1 tablet, Oral, Daily   sildenafil (REVATIO) 20 MG tablet TAKE 3 TO 4 TABLETS AT BEDTIME AS NEEDED       Objective:   Physical Exam BP 122/84 (BP Location: Left Arm, Patient Position: Sitting, Cuff Size: Normal)     Pulse 76    Temp 98.3 F (36.8 C) (Oral)    Resp 16    Ht 6\' 1"  (1.854 m)    Wt 209 lb 8 oz (95 kg)    SpO2 98%    BMI 27.64 kg/m  General: Well developed, NAD, BMI noted Neck: No  thyromegaly  HEENT:  Normocephalic . Face symmetric, atraumatic Lungs:  CTA B Normal respiratory effort, no intercostal retractions, no accessory muscle use. Heart: RRR,  no murmur.  Abdomen:  Not distended, soft, non-tender. No rebound or rigidity.   Lower extremities: no pretibial edema bilaterally  Skin: Exposed areas without rash. Not pale. Not jaundice Neurologic:  alert & oriented X3.  Speech normal, gait appropriate for age and unassisted Strength symmetric and appropriate for age.  Psych: Cognition and judgment appear intact.  Cooperative with normal attention span and concentration.  Behavior appropriate. No anxious or depressed appearing.     Assessment     Assessment  Anxiety, insomnia  ED H/o gonorrhea, h/o chlamydia 4/19   Plan: Here for a CPX Anxiety, insomnia: Doing well, not taking any medication  ED: Sildenafil refilled. Allergies: Claritin refilled. RTC 1 year    This visit occurred during the SARS-CoV-2 public health emergency.  Safety protocols were in place, including screening questions prior to the visit, additional usage of staff PPE, and extensive cleaning of exam room while observing appropriate contact time as indicated for disinfecting solutions.

## 2021-08-07 LAB — RPR: RPR Ser Ql: NONREACTIVE

## 2021-08-07 LAB — HIV ANTIBODY (ROUTINE TESTING W REFLEX): HIV 1&2 Ab, 4th Generation: NONREACTIVE

## 2021-08-07 LAB — HEPATITIS C ANTIBODY
Hepatitis C Ab: NONREACTIVE
SIGNAL TO CUT-OFF: 0.14 (ref <1.00)

## 2021-10-12 IMAGING — DX DG ANKLE COMPLETE 3+V*L*
3 series · 3 of 3 positions shown · non-contrast
Comparison: None

CLINICAL DATA: Swelling and decreased range of motion.

EXAM:
LEFT ANKLE COMPLETE - 3+ VIEW

[ankle ap]
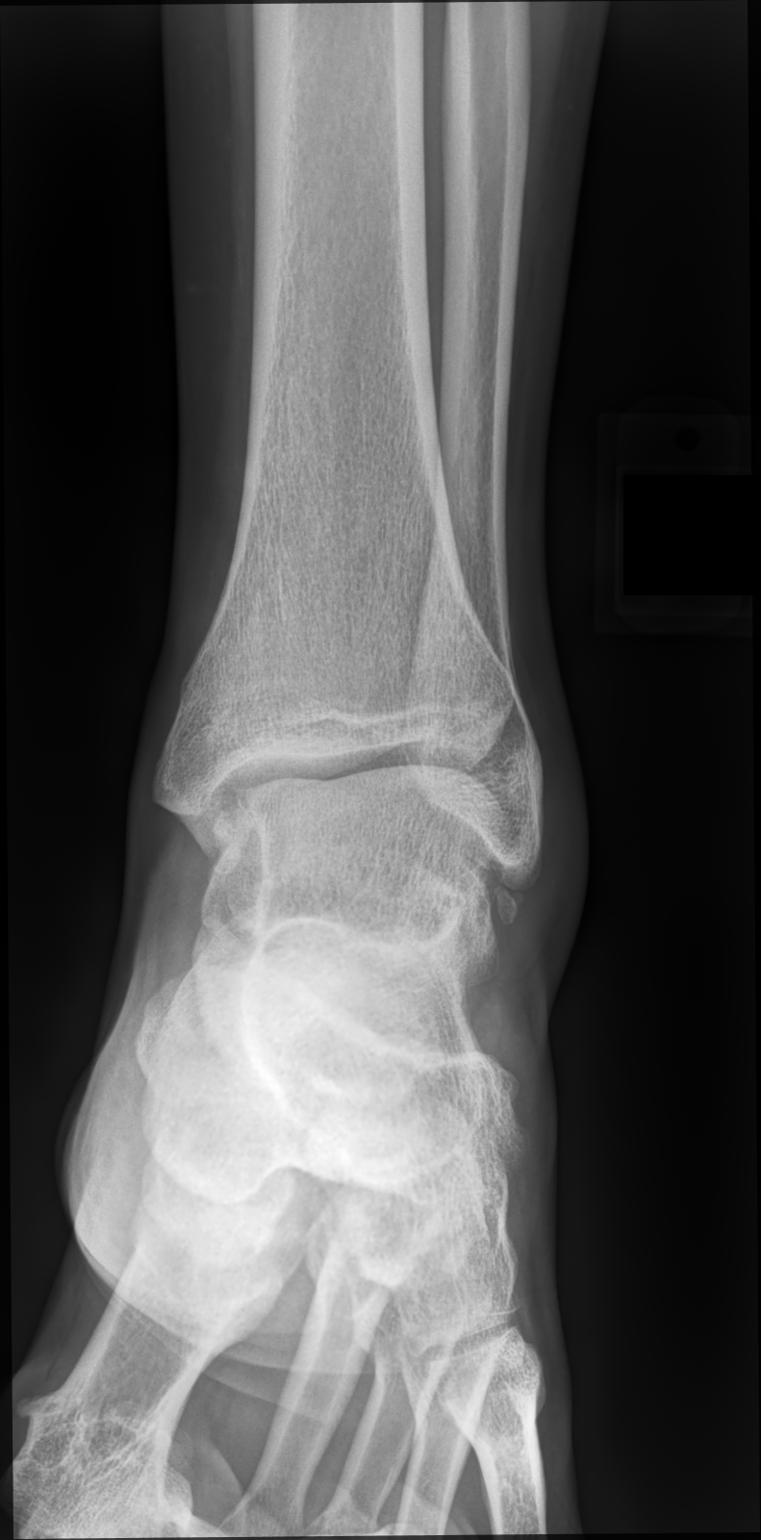

[ankle medial oblique]
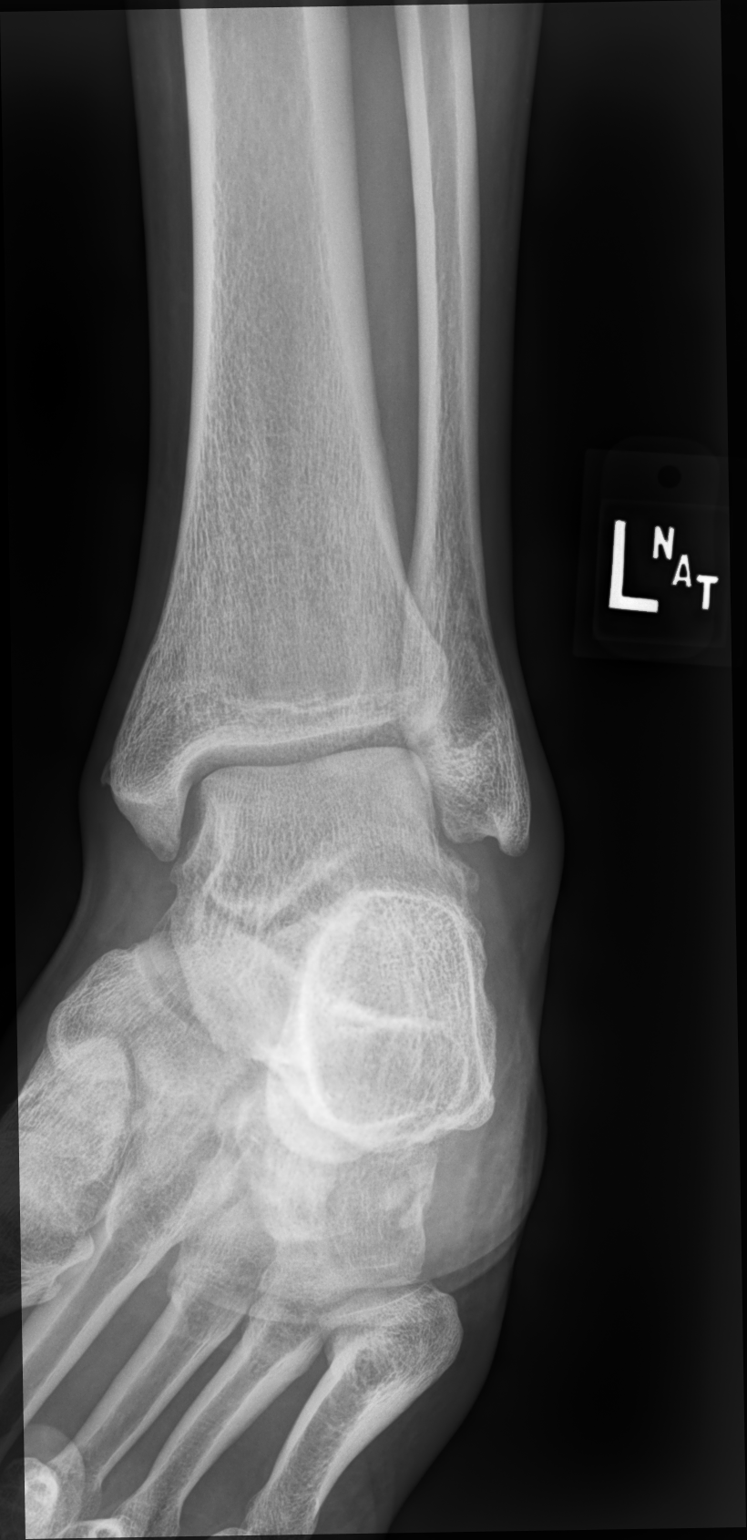

[ankle lat]
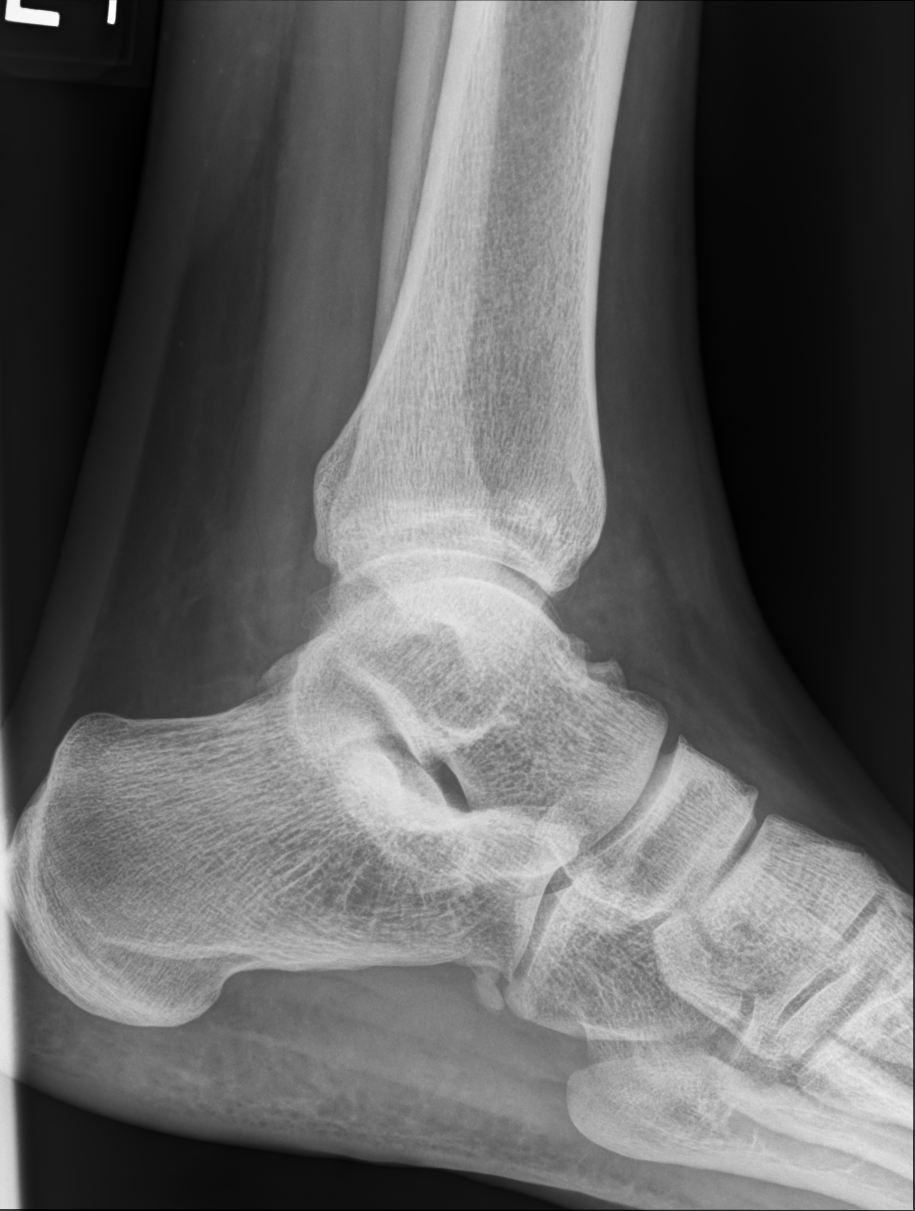

[3 of 3 positions shown; findings below may reference images not displayed]

FINDINGS: Mild lateral soft tissue swelling. Well corticated os ossific
densities adjacent to the tip of the lateral malleolus the
compatible with benign ossicles versus sequelae of remote trauma. No
signs of acute fracture or dislocation. No radio-opaque foreign body
or soft tissue calcifications.
IMPRESSION: 1. No acute bone abnormality.
2. Lateral soft tissue swelling.

## 2021-12-01 ENCOUNTER — Other Ambulatory Visit (HOSPITAL_BASED_OUTPATIENT_CLINIC_OR_DEPARTMENT_OTHER): Payer: Self-pay

## 2022-01-08 ENCOUNTER — Other Ambulatory Visit (HOSPITAL_BASED_OUTPATIENT_CLINIC_OR_DEPARTMENT_OTHER): Payer: Self-pay

## 2022-01-25 ENCOUNTER — Other Ambulatory Visit (HOSPITAL_BASED_OUTPATIENT_CLINIC_OR_DEPARTMENT_OTHER): Payer: Self-pay

## 2022-02-26 ENCOUNTER — Other Ambulatory Visit (HOSPITAL_BASED_OUTPATIENT_CLINIC_OR_DEPARTMENT_OTHER): Payer: Self-pay

## 2022-04-08 ENCOUNTER — Other Ambulatory Visit (HOSPITAL_BASED_OUTPATIENT_CLINIC_OR_DEPARTMENT_OTHER): Payer: Self-pay

## 2022-08-03 ENCOUNTER — Other Ambulatory Visit: Payer: Self-pay | Admitting: Internal Medicine

## 2022-08-03 ENCOUNTER — Other Ambulatory Visit (HOSPITAL_BASED_OUTPATIENT_CLINIC_OR_DEPARTMENT_OTHER): Payer: Self-pay

## 2022-08-03 MED ORDER — SILDENAFIL CITRATE 20 MG PO TABS
60.0000 mg | ORAL_TABLET | Freq: Every evening | ORAL | 5 refills | Status: DC | PRN
  Filled 2022-08-03: qty 30, 7d supply, fill #0
  Filled 2022-10-01: qty 30, 7d supply, fill #1
  Filled 2022-11-22: qty 30, 7d supply, fill #2
  Filled 2022-12-31: qty 30, 7d supply, fill #3
  Filled 2023-02-07: qty 30, 7d supply, fill #4
  Filled 2023-05-03: qty 30, 7d supply, fill #5

## 2022-08-09 ENCOUNTER — Encounter: Payer: BC Managed Care – PPO | Admitting: Internal Medicine

## 2022-10-01 ENCOUNTER — Other Ambulatory Visit: Payer: Self-pay | Admitting: Internal Medicine

## 2022-10-01 ENCOUNTER — Other Ambulatory Visit (HOSPITAL_BASED_OUTPATIENT_CLINIC_OR_DEPARTMENT_OTHER): Payer: Self-pay

## 2022-10-01 ENCOUNTER — Ambulatory Visit: Payer: Medicaid Other | Admitting: Internal Medicine

## 2022-10-01 ENCOUNTER — Encounter: Payer: Self-pay | Admitting: Internal Medicine

## 2022-10-01 ENCOUNTER — Other Ambulatory Visit: Payer: Self-pay

## 2022-10-01 VITALS — BP 118/80 | HR 70 | Temp 98.0°F | Resp 16 | Ht 73.0 in | Wt 207.1 lb

## 2022-10-01 DIAGNOSIS — Z113 Encounter for screening for infections with a predominantly sexual mode of transmission: Secondary | ICD-10-CM | POA: Diagnosis not present

## 2022-10-01 DIAGNOSIS — Z7251 High risk heterosexual behavior: Secondary | ICD-10-CM | POA: Diagnosis not present

## 2022-10-01 DIAGNOSIS — N50812 Left testicular pain: Secondary | ICD-10-CM

## 2022-10-01 DIAGNOSIS — R1032 Left lower quadrant pain: Secondary | ICD-10-CM

## 2022-10-01 LAB — CBC WITH DIFFERENTIAL/PLATELET
Basophils Absolute: 0 10*3/uL (ref 0.0–0.1)
Basophils Relative: 0.9 % (ref 0.0–3.0)
Eosinophils Absolute: 0.1 10*3/uL (ref 0.0–0.7)
Eosinophils Relative: 1.8 % (ref 0.0–5.0)
HCT: 45.2 % (ref 39.0–52.0)
Hemoglobin: 15.3 g/dL (ref 13.0–17.0)
Lymphocytes Relative: 59.9 % — ABNORMAL HIGH (ref 12.0–46.0)
Lymphs Abs: 2.2 10*3/uL (ref 0.7–4.0)
MCHC: 33.8 g/dL (ref 30.0–36.0)
MCV: 91.3 fl (ref 78.0–100.0)
Monocytes Absolute: 0.3 10*3/uL (ref 0.1–1.0)
Monocytes Relative: 7.5 % (ref 3.0–12.0)
Neutro Abs: 1.1 10*3/uL — ABNORMAL LOW (ref 1.4–7.7)
Neutrophils Relative %: 29.9 % — ABNORMAL LOW (ref 43.0–77.0)
Platelets: 263 10*3/uL (ref 150.0–400.0)
RBC: 4.95 Mil/uL (ref 4.22–5.81)
RDW: 12.5 % (ref 11.5–15.5)
WBC: 3.6 10*3/uL — ABNORMAL LOW (ref 4.0–10.5)

## 2022-10-01 LAB — URINALYSIS, ROUTINE W REFLEX MICROSCOPIC
Bilirubin Urine: NEGATIVE
Hgb urine dipstick: NEGATIVE
Ketones, ur: NEGATIVE
Leukocytes,Ua: NEGATIVE
Nitrite: NEGATIVE
Specific Gravity, Urine: 1.025 (ref 1.000–1.030)
Total Protein, Urine: NEGATIVE
Urine Glucose: NEGATIVE
Urobilinogen, UA: 0.2 (ref 0.0–1.0)
pH: 6 (ref 5.0–8.0)

## 2022-10-01 LAB — COMPREHENSIVE METABOLIC PANEL
ALT: 14 U/L (ref 0–53)
AST: 17 U/L (ref 0–37)
Albumin: 4.5 g/dL (ref 3.5–5.2)
Alkaline Phosphatase: 71 U/L (ref 39–117)
BUN: 12 mg/dL (ref 6–23)
CO2: 28 mEq/L (ref 19–32)
Calcium: 9.5 mg/dL (ref 8.4–10.5)
Chloride: 102 mEq/L (ref 96–112)
Creatinine, Ser: 1.12 mg/dL (ref 0.40–1.50)
GFR: 80.93 mL/min (ref >60.00)
Glucose, Bld: 73 mg/dL (ref 70–99)
Potassium: 4.1 mEq/L (ref 3.5–5.1)
Sodium: 138 mEq/L (ref 135–145)
Total Bilirubin: 0.8 mg/dL (ref 0.2–1.2)
Total Protein: 7.6 g/dL (ref 6.0–8.3)

## 2022-10-01 MED ORDER — LORATADINE 10 MG PO TABS
10.0000 mg | ORAL_TABLET | Freq: Every day | ORAL | 3 refills | Status: DC | PRN
  Filled 2022-10-01: qty 100, 100d supply, fill #0
  Filled 2023-04-01: qty 100, 100d supply, fill #1

## 2022-10-01 NOTE — Progress Notes (Unsigned)
   Subjective:    Patient ID: Maurice Hansen, male    DOB: [birth year], 42 y.o.   MRN: [id number]  DOS:  10/01/2022 Type of visit - description: Acute  2 weeks ago, shortly after he went to the gym to exercise, developed  left abdominal pain Some left testicular discomfort.  The testicle was never swollen.  The next day he also developed pain at the left back.  Over the last 2 weeks, symptoms are overall better except for persistent mild abdominal discomfort.  No fever or chills No nausea, vomiting, diarrhea.  No blood in the stools. Has been constipated  for the last 4 days. No rash on the abdomen or back. No dysuria, gross hematuria, difficulty urinating.  No penile discharge.  Review of Systems See above   Past Medical History:  Diagnosis Date   Anxiety    RHINITIS 09/16/2008   STD (male)    h/o gonorrhea     Past Surgical History:  Procedure Laterality Date   FOOT SURGERY     Right foot surgery as a child?    Current Outpatient Medications  Medication Instructions   loratadine (CLARITIN) 10 mg, Oral, Daily PRN   Multiple Vitamin (MULTIVITAMIN WITH MINERALS) TABS tablet 1 tablet, Oral, Daily   sildenafil (REVATIO) 60-80 mg, Oral, At bedtime PRN       Objective:   Physical Exam BP 118/80   Pulse 70   Temp 98 F (36.7 C) (Oral)   Resp 16   Ht 6\' 1"  (1.854 m)   Wt 207 lb 2 oz (94 kg)   SpO2 98%   BMI 27.33 kg/m  General:   Well developed, NAD, BMI noted.  HEENT:  Normocephalic . Face symmetric, atraumatic Lungs:  CTA B Normal respiratory effort, no intercostal retractions, no accessory muscle use. Heart: RRR,  no murmur.  Abdomen:  Not distended, soft, TTP to deep palpation. GU: Testicles: Normal in size, symmetric, no TTP. Penis: No discharge, no rash. Groins: No hernias Skin: Not pale. Not jaundice Lower extremities: no pretibial edema bilaterally  Neurologic:  alert & oriented X3.  Speech normal, gait appropriate for age and unassisted Psych--   Cognition and judgment appear intact.  Cooperative with normal attention span and concentration.  Behavior appropriate. No anxious or depressed appearing.     Assessment    Assessment  Anxiety, insomnia  ED H/o gonorrhea, h/o chlamydia 4/19   PLAN Left abdominal back pain.  Recent testicular pain Symptoms as described above, DDx is large but includes diverticulitis, UTI, urolithiasis, orchitis,  MSK pain,  others. On clinical grounds I do not suspect any significant or dangerous condition at this point.   MSK pain?. Plan: Check a CMP, CBC, UA urine culture.  Close observation.  If no better symptoms severe: Seek medical attention. STD checking: The patient is engage, has a single male partner, would like STD check.  Get Hep C,  RPR and HIV. Due for a CPX at his convenience

## 2022-10-01 NOTE — Patient Instructions (Addendum)
It was good to see you today  Please watch your symptoms closely.  If they are not gradually going away let me know.  You have any severe symptoms: Let me know or go to urgent care.  Provide blood test and urine sample  Please schedule a physical exam at your earliest convenience.    Vaccines I recommend:  Covid booster

## 2022-10-02 LAB — URINE CULTURE
MICRO NUMBER:: 14698568
Result:: NO GROWTH
SPECIMEN QUALITY:: ADEQUATE

## 2022-10-03 NOTE — Assessment & Plan Note (Signed)
Left abdominal back pain.  Recent testicular pain Symptoms as described above, DDx is large but includes diverticulitis, UTI, urolithiasis, orchitis,  MSK pain,  others. On clinical grounds I do not suspect any significant or dangerous condition at this point.   MSK pain?. Plan: Check a CMP, CBC, UA urine culture.  Close observation.  If no better symptoms severe: Seek medical attention. STD checking: The patient is engage, has a single male partner, would like STD check.  Get Hep C,  RPR and HIV. Due for a CPX at his convenience

## 2022-10-04 ENCOUNTER — Ambulatory Visit: Payer: Medicaid Other | Admitting: Internal Medicine

## 2022-10-04 LAB — HEPATITIS C ANTIBODY: Hepatitis C Ab: NONREACTIVE

## 2022-10-04 LAB — RPR: RPR Ser Ql: NONREACTIVE

## 2022-10-04 LAB — HIV ANTIBODY (ROUTINE TESTING W REFLEX): HIV 1&2 Ab, 4th Generation: NONREACTIVE

## 2022-10-22 ENCOUNTER — Encounter: Payer: Self-pay | Admitting: Internal Medicine

## 2022-10-22 ENCOUNTER — Ambulatory Visit (INDEPENDENT_AMBULATORY_CARE_PROVIDER_SITE_OTHER): Payer: Medicaid Other | Admitting: Internal Medicine

## 2022-10-22 VITALS — BP 130/80 | HR 73 | Temp 97.9°F | Resp 16 | Ht 73.0 in | Wt 210.5 lb

## 2022-10-22 DIAGNOSIS — Z0000 Encounter for general adult medical examination without abnormal findings: Secondary | ICD-10-CM

## 2022-10-22 DIAGNOSIS — R1032 Left lower quadrant pain: Secondary | ICD-10-CM | POA: Diagnosis not present

## 2022-10-22 LAB — CBC WITH DIFFERENTIAL/PLATELET
Basophils Absolute: 0 10*3/uL (ref 0.0–0.1)
Basophils Relative: 0.9 % (ref 0.0–3.0)
Eosinophils Absolute: 0.1 10*3/uL (ref 0.0–0.7)
Eosinophils Relative: 3.4 % (ref 0.0–5.0)
HCT: 42.4 % (ref 39.0–52.0)
Hemoglobin: 14.3 g/dL (ref 13.0–17.0)
Lymphocytes Relative: 50.3 % — ABNORMAL HIGH (ref 12.0–46.0)
Lymphs Abs: 2.1 10*3/uL (ref 0.7–4.0)
MCHC: 33.7 g/dL (ref 30.0–36.0)
MCV: 91 fl (ref 78.0–100.0)
Monocytes Absolute: 0.4 10*3/uL (ref 0.1–1.0)
Monocytes Relative: 10.1 % (ref 3.0–12.0)
Neutro Abs: 1.5 10*3/uL (ref 1.4–7.7)
Neutrophils Relative %: 35.3 % — ABNORMAL LOW (ref 43.0–77.0)
Platelets: 247 10*3/uL (ref 150.0–400.0)
RBC: 4.65 Mil/uL (ref 4.22–5.81)
RDW: 12.7 % (ref 11.5–15.5)
WBC: 4.1 10*3/uL (ref 4.0–10.5)

## 2022-10-22 LAB — LIPID PANEL
Cholesterol: 215 mg/dL — ABNORMAL HIGH (ref 0–200)
HDL: 41.9 mg/dL (ref >39.00)
LDL Cholesterol: 151 mg/dL — ABNORMAL HIGH (ref 0–99)
NonHDL: 173.04
Total CHOL/HDL Ratio: 5
Triglycerides: 110 mg/dL (ref 0.0–149.0)
VLDL: 22 mg/dL (ref 0.0–40.0)

## 2022-10-22 NOTE — Progress Notes (Signed)
Subjective:    Patient ID: Maurice Hansen, male    DOB: 08/29/1980, 42 y.o.   MRN: [id number]  DOS:  10/22/2022 Type of visit - description: cpx  Here for CPX See LOV, has some testicular discomfort that is resolved. Still has occasional dull ache at the LLQ abdomen.  Not worse with certain movements, no bulging areas, sometimes worse with certain foods. Denies nausea vomiting.  No blood in the stool Denies dysuria conservatory or any other LUTS.  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   Social History   Socioeconomic History   Marital status: Single    Spouse name: Not on file   Number of children: 1   Years of education: Not on file   Highest education level: Not on file  Occupational History   Occupation: insurance Co    Comment:    Tobacco Use   Smoking status: Former    Types: Cigarettes    Quit date: 03/21/2011    Years since quitting: 11.5   Smokeless tobacco: Never   Tobacco comments:    occ cigar  Substance and Sexual Activity   Alcohol use: Yes    Comment: socially    Drug use: No   Sexual activity: Not on file  Other Topics Concern   Not on file  Social History Narrative   Originally from [place] .    Moved to [place] aprox 2006    Lives by himself, child 2014    Social Determinants of Health   Financial Resource Strain: Not on file  Food Insecurity: Not on file  Transportation Needs: Not on file  Physical Activity: Not on file  Stress: Not on file  Social Connections: Not on file  Intimate Partner Violence: Not on file    Current Outpatient Medications  Medication Instructions   loratadine (CLARITIN) 10 mg, Oral, Daily PRN   Multiple Vitamin (MULTIVITAMIN WITH MINERALS) TABS tablet 1 tablet,  Oral, Daily   sildenafil (REVATIO) 60-80 mg, Oral, At bedtime PRN       Objective:   Physical Exam BP 130/80   Pulse 73   Temp 97.9 F (36.6 C) (Oral)   Resp 16   Ht 6\' 1"  (1.854 m)   Wt 210 lb 8 oz (95.5 kg)   SpO2 98%   BMI 27.77 kg/m  General:   Well developed, NAD, BMI noted.  HEENT:  Normocephalic . Face symmetric, atraumatic Lungs:  CTA B Normal respiratory effort, no intercostal retractions, no accessory muscle use. Heart: RRR,  no murmur.  Abdomen:  Not distended, soft, non-tender. No rebound or rigidity. Groins: No LADs, no hernias. Skin: Not pale. Not jaundice Lower extremities: no pretibial edema bilaterally  Neurologic:  alert & oriented X3.  Speech normal, gait appropriate for age and unassisted Psych--  Cognition and judgment appear intact.  Cooperative with normal attention span and concentration.  Behavior appropriate. No anxious or depressed appearing.     Assessment     Assessment  Anxiety, insomnia  ED H/o gonorrhea, h/o chlamydia 4/19   PLAN Here for CPX.  See separate documentation ED: On sildenafil as needed L LQ abdominal  pain, back pain, testicular pain: See LOV, workup was essentially negative, urinalysis showed few bacteria but no WBCs.  Urine culture was negative. At this point he only has some dull ache on the LLQ, plan: Recheck CBC to be sure there is no acute anemia, Hemoccults x 3, urine for G&C. Call if not gradually better or if symptoms increase. RTC 1 year.

## 2022-10-22 NOTE — Assessment & Plan Note (Signed)
-  Td 2014  -Vaccines are recommended at the pharmacy: Td, COVID booster, flu shot every fall. -CCS (+) FH colon cancer, father, in his 69s.  To start screening age 42 -Labs:  FLP CBC Hemoccults x 3. G&C - pt education:  safe sex.  Diet and exercise

## 2022-10-22 NOTE — Patient Instructions (Addendum)
Return the stool card with samples from 3 consecutive   days  Vaccines are recommended at the pharmacy: Td, COVID booster, flu shot every fall.   GO TO THE LAB : Get the blood work, urine sample   GO TO THE FRONT DESK, PLEASE SCHEDULE YOUR APPOINTMENTS Come back for a physical exam in 1 year   Safe Sex Practicing safe sex means taking steps before and during sex to reduce your risk of: Getting an STI (sexually transmitted infection). Giving your partner an STI. Unwanted or unplanned pregnancy. How to practice safe sex Ways you can practice safe sex  Limit your sexual partners to only one partner who is having sex with only you. Avoid using alcohol and drugs before having sex. Alcohol and drugs can affect your judgment. Before having sex with a new partner: Talk to your partner about past partners, past STIs, and drug use. Get screened for STIs and discuss the results with your partner. Ask your partner to get screened too. Check your body regularly for sores, blisters, rashes, or unusual discharge. If you notice any of these problems, visit your health care provider. Avoid sexual contact if you have symptoms of an infection or you are being treated for an STI. While having sex, use a condom. Make sure to: Use a condom every time you have vaginal, oral, or anal sex. Both females and males should wear condoms during oral sex. Keep condoms in place from the beginning to the end of sexual activity. Use a latex condom, if possible. Latex condoms offer the best protection. Use only water-based lubricants with a condom. Using petroleum-based lubricants or oils will weaken the condom and increase the chance that it will break. Ways your health care provider can help you practice safe sex  See your health care provider for regular screenings, exams, and tests for STIs. Talk with your health care provider about what kind of birth control (contraception) is best for you. Get vaccinated  against hepatitis B and human papillomavirus (HPV). If you are at risk of being infected with HIV (human immunodeficiency virus), talk with your health care provider about taking a prescription medicine to prevent HIV infection. You are at risk for HIV if you: Are a man who has sex with other men. Are sexually active with more than one partner. Take drugs by injection. Have a sex partner who has HIV. Have unprotected sex. Have sex with someone who has sex with both men and women. Have had an STI. Follow these instructions at home: Take over-the-counter and prescription medicines only as told by your health care provider. Keep all follow-up visits. This is important. Where to find more information Centers for Disease Control and Prevention: FootballExhibition.com.brwww.cdc.gov Planned Parenthood: www.plannedparenthood.org Office on [employer]'s [employer]: http://hoffman.com/www.womenshealth.gov Summary Practicing safe sex means taking steps before and during sex to reduce your risk getting an STI, giving your partner an STI, and having an unwanted or unplanned pregnancy. Before having sex with a new partner, talk to your partner about past partners, past STIs, and drug use. Use a condom every time you have vaginal, oral, or anal sex. Both females and males should wear condoms during oral sex. Check your body regularly for sores, blisters, rashes, or unusual discharge. If you notice any of these problems, visit your health care provider. See your health care provider for regular screenings, exams, and tests for STIs. This information is not intended to replace advice given to you by your health care provider. Make sure you discuss  any questions you have with your health care provider. Document Revised: 12/10/2019 Document Reviewed: 12/10/2019 Elsevier Patient Education  2023 [employer].

## 2022-10-22 NOTE — Assessment & Plan Note (Signed)
Here for CPX.  See separate documentation ED: On sildenafil as needed L LQ abdominal pain, back pain, testicular pain: See LOV, workup was essentially negative, urinalysis showed few bacteria but no WBCs.  Urine culture was negative. At this point he only has some dull ache on the LLQ, plan: Recheck CBC to be sure there is no acute anemia, Hemoccults x 3, urine for G&C. Call if not gradually better or if symptoms increase. RTC 1 year.

## 2022-10-23 LAB — C. TRACHOMATIS/N. GONORRHOEAE RNA
C. trachomatis RNA, TMA: NOT DETECTED
N. gonorrhoeae RNA, TMA: NOT DETECTED

## 2022-10-28 ENCOUNTER — Other Ambulatory Visit: Payer: Medicaid Other

## 2022-10-28 DIAGNOSIS — R1032 Left lower quadrant pain: Secondary | ICD-10-CM

## 2022-10-29 ENCOUNTER — Other Ambulatory Visit (INDEPENDENT_AMBULATORY_CARE_PROVIDER_SITE_OTHER): Payer: Medicaid Other

## 2022-10-29 DIAGNOSIS — R1032 Left lower quadrant pain: Secondary | ICD-10-CM

## 2022-10-29 LAB — HEMOCCULT SLIDES (X 3 CARDS)
Fecal Occult Blood: NEGATIVE
OCCULT 1: NEGATIVE
OCCULT 2: NEGATIVE
OCCULT 3: NEGATIVE
OCCULT 4: NEGATIVE
OCCULT 5: NEGATIVE

## 2022-11-22 ENCOUNTER — Other Ambulatory Visit (HOSPITAL_BASED_OUTPATIENT_CLINIC_OR_DEPARTMENT_OTHER): Payer: Self-pay

## 2022-12-31 ENCOUNTER — Other Ambulatory Visit (HOSPITAL_BASED_OUTPATIENT_CLINIC_OR_DEPARTMENT_OTHER): Payer: Self-pay

## 2023-02-07 ENCOUNTER — Ambulatory Visit: Payer: Medicaid Other | Admitting: Internal Medicine

## 2023-02-07 ENCOUNTER — Other Ambulatory Visit (HOSPITAL_BASED_OUTPATIENT_CLINIC_OR_DEPARTMENT_OTHER): Payer: Self-pay

## 2023-02-07 VITALS — BP 126/80 | HR 69 | Temp 97.9°F | Resp 16 | Ht 73.0 in | Wt 211.4 lb

## 2023-02-07 DIAGNOSIS — R1032 Left lower quadrant pain: Secondary | ICD-10-CM

## 2023-02-07 NOTE — Progress Notes (Unsigned)
   Subjective:    Patient ID: Maurice Hansen, male    DOB: 09-21-1980, 42 y.o.   MRN: [id number]  DOS:  02/07/2023 Type of visit - description: Acute  Continue with left-sided abdominal discomfort. Symptoms started early March, discomfort is described as a "burning, dull pain". It is steady, constant, over time is getting less intense but has not stopped. At some point was radiating to the GU area but that is no longer the case.   Worse with food?  Sometimes Worse with turning the torso?  No.  Denies fever or chills No unusual weight loss No nausea vomiting No LUTS.  Wt Readings from Last 3 Encounters:  02/07/23 211 lb 6 oz (95.9 kg)  10/22/22 210 lb 8 oz (95.5 kg)  10/01/22 207 lb 2 oz (94 kg)     Review of Systems See above   Past Medical History:  Diagnosis Date   Anxiety    RHINITIS 09/16/2008   STD (male)    h/o gonorrhea     Past Surgical History:  Procedure Laterality Date   FOOT SURGERY     Right foot surgery as a child?    Current Outpatient Medications  Medication Instructions   loratadine (CLARITIN) 10 mg, Oral, Daily PRN   Multiple Vitamin (MULTIVITAMIN WITH MINERALS) TABS tablet 1 tablet, Oral, Daily   sildenafil (REVATIO) 60-80 mg, Oral, At bedtime PRN       Objective:   Physical Exam BP 126/80   Pulse 69   Temp 97.9 F (36.6 C) (Oral)   Resp 16   Ht 6\' 1"  (1.854 m)   Wt 211 lb 6 oz (95.9 kg)   SpO2 98%   BMI 27.89 kg/m  General:   Well developed, NAD, BMI noted.  HEENT:  Normocephalic . Face symmetric, atraumatic Abdomen:  Not distended, soft, non-tender. No rebound or rigidity.   Skin: Not pale. Not jaundice Lower extremities: no pretibial edema bilaterally  Neurologic:  alert & oriented X3.  Speech normal, gait appropriate for age and unassisted Psych--  Cognition and judgment appear intact.  Cooperative with normal attention span and concentration.  Behavior appropriate. No anxious or depressed appearing.     Assessment      Assessment  Anxiety, insomnia  ED H/o gonorrhea, h/o chlamydia 4/19   PLAN Left abdominal pain: Symptoms started early March, this is the third visit for such symptom. The discomfort is limited to the abdomen, denies GU/testicular pain. Previous labs and urine test negative. Plan: Rechecking UA, CT abdomen and pelvis.  If negative, consider GI.

## 2023-02-07 NOTE — Patient Instructions (Signed)
Provide urine sample today  Will arrange CAT scan of the abdomen and pelvis

## 2023-02-08 LAB — URINALYSIS, ROUTINE W REFLEX MICROSCOPIC
Bilirubin Urine: NEGATIVE
Hgb urine dipstick: NEGATIVE
Ketones, ur: NEGATIVE
Leukocytes,Ua: NEGATIVE
Nitrite: NEGATIVE
RBC / HPF: NONE SEEN (ref 0–?)
Specific Gravity, Urine: 1.02 (ref 1.000–1.030)
Total Protein, Urine: NEGATIVE
Urine Glucose: NEGATIVE
Urobilinogen, UA: 1 (ref 0.0–1.0)
pH: 7 (ref 5.0–8.0)

## 2023-02-08 LAB — URINE CULTURE
MICRO NUMBER:: 15229317
Result:: NO GROWTH
SPECIMEN QUALITY:: ADEQUATE

## 2023-02-08 NOTE — Assessment & Plan Note (Signed)
Left abdominal pain: Symptoms started early March, this is the third visit for such symptom. The discomfort is limited to the abdomen, denies GU/testicular pain. Previous labs and urine test negative. Plan: Rechecking UA, CT abdomen and pelvis.  If negative, consider GI.

## 2023-02-14 ENCOUNTER — Telehealth (HOSPITAL_BASED_OUTPATIENT_CLINIC_OR_DEPARTMENT_OTHER): Payer: Self-pay

## 2023-02-16 ENCOUNTER — Telehealth (HOSPITAL_BASED_OUTPATIENT_CLINIC_OR_DEPARTMENT_OTHER): Payer: Self-pay

## 2023-02-17 ENCOUNTER — Telehealth: Payer: Medicaid Other | Admitting: Internal Medicine

## 2023-02-17 DIAGNOSIS — R1032 Left lower quadrant pain: Secondary | ICD-10-CM

## 2023-02-17 NOTE — Telephone Encounter (Signed)
Pt said that the imaging has received his CT order but they told him a pre certification needs to be done and it hasn't been. Please call pt back to advise status of pre certification

## 2023-02-17 NOTE — Telephone Encounter (Signed)
Spoke w/ Pt- informed that I received fax on 7/23 that his insurance requested further information before they could approve CT scan, (OV notes, labs, etc). Informed I faxed those on 7/23, and again yesterday afternoon, hoping to get a determination soon. Pt verbalized understanding.

## 2023-02-25 NOTE — Telephone Encounter (Signed)
Pt called to follow up on CT status. Advised that we had sent off supporting documentation to his insurance to get it authorized. Pt advised that he had reached out to the insurance before calling our office and they had stated they had sent a decision to [place] on the CT. Advised that a message would be sent back to look into this as it seemed we had not received that information. Pt would like a call back once info is available.

## 2023-02-25 NOTE — Telephone Encounter (Signed)
No , might be [place] , will send it back to her box .Marland Kitchen

## 2023-02-28 NOTE — Telephone Encounter (Signed)
Noted, PCP requesting appeal/peer to peer, I will have to call and set up.

## 2023-02-28 NOTE — Telephone Encounter (Signed)
Called [employer] at [phone], spoke w/ Janna Arch, appeal started, no further information needed, they do have all required ov notes and labs. Appeal can take up to 14 business days to receive a determination.

## 2023-03-04 NOTE — Telephone Encounter (Signed)
Called [employer] [facility] at number below- briefly spoke with representative- states she could see where I spoke w/ Janna Arch on 02/28/23 but asked to place me on brief 2-3 minute hold to review notes, after waiting 5-6 minutes (total call length 15 minutes), I had to disconnect call.

## 2023-03-04 NOTE — Telephone Encounter (Signed)
Please call them again, we have not heard from them.  I know they said 14 days but this is taking too long.

## 2023-03-16 NOTE — Telephone Encounter (Signed)
Advise patient, we have not been able to get a CT as recommended. If abdominal pain is worse: Schedule a visit here. Otherwise refer to GI for abdominal pain.

## 2023-03-16 NOTE — Addendum Note (Signed)
Addended byConrad Tatum D on: 03/16/2023 04:17 PM   Modules accepted: Orders

## 2023-03-16 NOTE — Telephone Encounter (Signed)
Received mailed letter by Maurice Hansen- they received appeal on 8/13, appeal determination should be reached by 03/31/23.

## 2023-03-16 NOTE — Telephone Encounter (Signed)
LMOM informing Pt of PCP recommendations. GI referral placed.

## 2023-03-17 NOTE — Telephone Encounter (Addendum)
Called [phone] was routed to [place] at [employer] [facility] [employer])- contracted group that does imaging approvals for [employer]- she informed me that appeals had to go through the health plan directly, she gave me provider services number to call [phone]. I called and got in touch with Berniece Pap (call reference number: [id number])- she also works with Gerhard Munch- she was able to inform me that all needed information was received w/ original CT request (they received 3 faxes, one of their doctors there received them but didn't have x-rays, ultrasounds or scopes that they were looking for). The appeal request has been sent off directly to the health plan, when decision has been made (either approval or denial) it will then be sent back to [employer] and [place] and the Pt will be made aware. This can happen anytime between now and March 31, 2023.

## 2023-03-17 NOTE — Telephone Encounter (Signed)
Ultrasound or plain x-rays are not appropriate for evaluation of left-sided abdominal pain.  Please let him know.  He should discuss that with his insurance.  He has been referred to gastroenterology. Arrange office visit if further explanation for him is needed

## 2023-03-17 NOTE — Telephone Encounter (Signed)
Spoke w/ Pt- informed of below. Pt expresses his frustrations. I do apologize to him. He states he is again going to call his insurance and they will probably tell him they don't have the appeal. I again informed I have the physical mailed letter from [employer] dated 03/02/23 informing they have received and started the appeal, and should have a decision back by 03/31/23. I again urged him to schedule an appt with GI when they call to schedule an appt.

## 2023-03-17 NOTE — Telephone Encounter (Signed)
Spoke with patient.  Patient states that according to insurance there is a state requirement for CT Scans to have other imaging ([place], xray, etc) prior to ordering CT. Patient questions why this was not done since that is the reason for the first denial, according to patient.  Patient would still like to have call with Dr. Drue Novel to discuss.  According to prior documentation, insurance appeal will be determined by 03/31/23. Patient frustrated with wait time.  Patient encouraged to proceed with GI appointment once the contact him.

## 2023-03-17 NOTE — Telephone Encounter (Signed)
Pt called back- he informed that he just got off the phone w/ his insurance, he informed me that his CT was originally denied because an x-ray or ultrasound was not completed which is a requirement for a CT scan by [facility], he was informed by Bonne Dolores that a peer to peer review was completed by Dr. Drue Novel on 02/16/23 and wanted to know why this wasn't ordered then. I informed Pt that a peer to peer is a phone call phone doctors- I'm not privileged to what is discussed during those phone calls. Pt is requesting a call back directly from Dr. Drue Novel- he is aware that PCP is out of office, he then asks that my direct manager or RN, Selena Batten give him a call today.

## 2023-03-17 NOTE — Telephone Encounter (Signed)
Spoke w/ Maurice Hansen this morning, he informed me that he spoke w/ Delaney Meigs at [employer] this morning- he was informed by them that the original CT was denied because OV notes and labs were not received when original approval for CT was sent. Informed Maurice Hansen of all conversations below. He asks that I again try to reach out to [employer] to see if I can find out anything- I agreed to try.

## 2023-03-22 ENCOUNTER — Encounter: Payer: Self-pay | Admitting: Gastroenterology

## 2023-03-22 NOTE — Telephone Encounter (Signed)
Notify patient of his insurance decision. Recommend to see GI as recommended. If unable to see GI in a timely manner and symptoms are increasing, recommend to come back to this office for reassessment

## 2023-03-22 NOTE — Telephone Encounter (Signed)
Spoke with patient, notified of insurance decision and PCP recommendations.  Patient states his discomfort is the same, not getting worse.  Patient plans to call insurance company and [facility] for appointment.  Advised to see PCP again if pain worsens or GI appt too far out. Patient verbalized understanding, appreciated call.

## 2023-03-22 NOTE — Telephone Encounter (Addendum)
Received message from [facility]- denial was upheld by [employer].

## 2023-03-27 ENCOUNTER — Ambulatory Visit (HOSPITAL_COMMUNITY)
Admission: EM | Admit: 2023-03-27 | Discharge: 2023-03-27 | Disposition: A | Discharge status: Home / Self Care | Payer: Medicaid Other | Attending: Emergency Medicine | Admitting: Emergency Medicine

## 2023-03-27 ENCOUNTER — Encounter (HOSPITAL_COMMUNITY): Payer: Self-pay | Admitting: Emergency Medicine

## 2023-03-27 ENCOUNTER — Other Ambulatory Visit: Payer: Self-pay

## 2023-03-27 DIAGNOSIS — J069 Acute upper respiratory infection, unspecified: Secondary | ICD-10-CM | POA: Diagnosis not present

## 2023-03-27 MED ORDER — FLUTICASONE PROPIONATE 50 MCG/ACT NA SUSP: 2.0000 | Freq: Every day | NASAL | 2 refills | Status: DC

## 2023-03-27 MED ORDER — BENZONATATE 100 MG PO CAPS: 100.0000 mg | ORAL_CAPSULE | Freq: Three times a day (TID) | ORAL | 0 refills | Status: DC

## 2023-03-27 NOTE — ED Provider Notes (Signed)
[facility]    CSN: [id number] Arrival date & time: 03/27/23  1328     History   Chief Complaint Chief Complaint  Patient presents with   URI   HPI Maurice Hansen is a 42 y.o. male.  Here with 4-5 day history of runny nose, ear pressure, cough Cough became productive of some phlegm Congestion has improved with saline rinses No known fevers. Denies abdominal pain, NVD, rash. No shortness of breath or chest tightness.  Has tried mucinex and a Claritin tablet  Possible sick contacts  No recent travel  Past Medical History:  Diagnosis Date   Anxiety    RHINITIS 09/16/2008   STD (male)    h/o gonorrhea     Patient Active Problem List   Diagnosis Date Noted   PCP NOTES >>>>>>>>>>>>>> 12/09/2015   Insomnia 05/10/2014   Anxiety 09/10/2013   Annual physical exam 05/21/2011   TRANSAMINASES, SERUM, ELEVATED 09/23/2009   RHINITIS 09/16/2008    Past Surgical History:  Procedure Laterality Date   FOOT SURGERY     Right foot surgery as a child?    Home Medications    Prior to Admission medications   Medication Sig Start Date End Date Taking? Authorizing Provider  benzonatate (TESSALON) 100 MG capsule Take 1 capsule (100 mg total) by mouth every 8 (eight) hours. 03/27/23  Yes Yvonne Stopher, Lurena Joiner, PA-C  fluticasone (FLONASE) 50 MCG/ACT nasal spray Place 2 sprays into both nostrils daily. 03/27/23  Yes Marvine Encalade, Lurena Joiner, PA-C  loratadine (CLARITIN) 10 MG tablet Take 1 tablet (10 mg total) by mouth daily as needed for allergies or rhinitis. 10/01/22   Wanda Plump, MD  Multiple Vitamin (MULTIVITAMIN WITH MINERALS) TABS tablet Take 1 tablet by mouth daily.    [provider]  sildenafil (REVATIO) 20 MG tablet Take 3-4 tablets (60-80 mg total) by mouth at bedtime as needed. 08/03/22   Wanda Plump, MD    Family History Family History  Problem Relation Age of Onset   Thyroid disease Mother        hyperthyroid   Breast cancer Mother    Colon cancer Father 75   Stroke  Maternal Grandmother        GM and GF   Heart disease Maternal Uncle    Prostate cancer Maternal Uncle 12 - 79   Diabetes Other        GM, GF   Hypertension Other        Gparents    Colon cancer Other        M Great Uncle, M Niece    Social History Social History   Tobacco Use   Smoking status: Former    Current packs/day: 0.00    Types: Cigarettes    Quit date: 03/21/2011    Years since quitting: 12.0   Smokeless tobacco: Never   Tobacco comments:    occ cigar  Substance Use Topics   Alcohol use: Yes    Comment: socially    Drug use: No     Allergies   Patient has no known allergies.   Review of Systems Review of Systems Per HPI  Physical Exam Triage Vital Signs ED Triage Vitals  Encounter Vitals Group     BP 03/27/23 1439 117/80     Systolic BP Percentile --      Diastolic BP Percentile --      Pulse Rate 03/27/23 1439 100     Resp 03/27/23 1439 19     Temp  03/27/23 1439 98.1 F (36.7 C)     Temp Source 03/27/23 1439 Oral     SpO2 03/27/23 1439 98 %     Weight 03/27/23 1440 211 lb 10.3 oz (96 kg)     Height 03/27/23 1440 6\' 2"  (1.88 m)     Head Circumference --      Peak Flow --      Pain Score 03/27/23 1440 0     Pain Loc --      Pain Education --      Exclude from Growth Chart --    No data found.  Updated Vital Signs BP 117/80 (BP Location: Right Arm)   Pulse 100   Temp 99.6 F (37.6 C) (Oral)   Resp 19   Ht 6\' 2"  (1.88 m)   Wt 211 lb 10.3 oz (96 kg)   SpO2 98%   BMI 27.17 kg/m   Physical Exam Vitals and nursing note reviewed.  Constitutional:      General: He is not in acute distress. HENT:     Right Ear: Tympanic membrane and ear canal normal.     Left Ear: Tympanic membrane and ear canal normal.     Nose: No congestion or rhinorrhea.     Mouth/Throat:     Mouth: Mucous membranes are moist.     Pharynx: Oropharynx is clear. No posterior oropharyngeal erythema.  Eyes:     Conjunctiva/sclera: Conjunctivae normal.   Cardiovascular:     Rate and Rhythm: Normal rate and regular rhythm.     Pulses: Normal pulses.     Heart sounds: Normal heart sounds.  Pulmonary:     Effort: Pulmonary effort is normal.     Breath sounds: Normal breath sounds.  Musculoskeletal:     Cervical back: Normal range of motion.  Lymphadenopathy:     Cervical: No cervical adenopathy.  Skin:    General: Skin is warm and dry.  Neurological:     Mental Status: He is alert and oriented to person, place, and time.     UC Treatments / Results  Labs (all labs ordered are listed, but only abnormal results are displayed) Labs Reviewed - No data to display  EKG  Radiology No results found.  Procedures Procedures  Medications Ordered in UC Medications - No data to display  Initial Impression / Assessment and Plan / UC Course  I have reviewed the triage vital signs and the nursing notes.  Pertinent labs & imaging results that were available during my care of the patient were reviewed by me and considered in my medical decision making (see chart for details).  Afebrile and well appearing, clear lungs Reassurance provided Suspect viral etiology. Discussed usual course of virus. Advised symptomatic care at home. Recommend add nasal spray daily and tessalon TID prn. Work note provided. Return precautions discussed. Patient agreeable to plan, all questions answered   Final Clinical Impressions(s) / UC Diagnoses   Final diagnoses:  Viral URI with cough     Discharge Instructions      Continue Claritin/loratadine daily Add daily nasal spray such as Flonase Try tylenol and/or ibuprofen for aches/fever Continue Mucinex for congestion and cough.  This works best if you are very hydrated so drink lots of fluids! The Tessalon cough pills can be taken 3 times daily as needed.  If it makes you drowsy, take only 1 pill before bedtime  Hopefully you notice improvement over the next few days.  It may take some more time for  symptoms to improve and resolve    ED Prescriptions     Medication Sig Dispense Auth. Provider   fluticasone (FLONASE) 50 MCG/ACT nasal spray Place 2 sprays into both nostrils daily. 9.9 mL Jakhi Dishman, PA-C   benzonatate (TESSALON) 100 MG capsule Take 1 capsule (100 mg total) by mouth every 8 (eight) hours. 30 capsule Enrika Aguado, Lurena Joiner, PA-C      PDMP not reviewed this encounter.   Marlow Baars, [place] 03/27/23 1734

## 2023-03-27 NOTE — Discharge Instructions (Addendum)
Continue Claritin/loratadine daily Add daily nasal spray such as Flonase Try tylenol and/or ibuprofen for aches/fever Continue Mucinex for congestion and cough.  This works best if you are very hydrated so drink lots of fluids! The Tessalon cough pills can be taken 3 times daily as needed.  If it makes you drowsy, take only 1 pill before bedtime  Hopefully you notice improvement over the next few days.  It may take some more time for symptoms to improve and resolve

## 2023-03-27 NOTE — ED Triage Notes (Signed)
Patient is c/o increase cough and sinus infection for a week.

## 2023-04-01 ENCOUNTER — Encounter: Payer: Self-pay | Admitting: Internal Medicine

## 2023-04-01 ENCOUNTER — Other Ambulatory Visit (HOSPITAL_BASED_OUTPATIENT_CLINIC_OR_DEPARTMENT_OTHER): Payer: Self-pay

## 2023-04-01 ENCOUNTER — Ambulatory Visit: Payer: Medicaid Other | Admitting: Internal Medicine

## 2023-04-01 VITALS — BP 120/82 | HR 85 | Temp 98.2°F | Resp 16 | Ht 73.0 in | Wt 207.0 lb

## 2023-04-01 DIAGNOSIS — J069 Acute upper respiratory infection, unspecified: Secondary | ICD-10-CM

## 2023-04-01 MED ORDER — AMOXICILLIN 875 MG PO TABS: 875.0000 mg | ORAL_TABLET | Freq: Two times a day (BID) | ORAL | 0 refills | Status: DC

## 2023-04-01 NOTE — Progress Notes (Unsigned)
Subjective:    Patient ID: Maurice Hansen, male    DOB: 03-01-1981, 42 y.o.   MRN: [id number]  DOS:  04/01/2023 Type of visit - description: UC  f/u  Onset of symptoms 03/22/2023. Cough with some mucus production, not sure if it is coming from the nose of the chest. Some runny nose Ear pressure, bilateral. At the beginning he had subjective fever. Also the beginning had aches and pains, that is better. No nausea vomiting. No itchy eyes, sneezing or itchy nose. Went to urgent care 03/27/2023. Rx benzonatate and fluticasone nasal sprays.  He is here because he continue with some ear pressure and cough.  Review of Systems See above   Past Medical History:  Diagnosis Date   Anxiety    RHINITIS 09/16/2008   STD (male)    h/o gonorrhea     Past Surgical History:  Procedure Laterality Date   FOOT SURGERY     Right foot surgery as a child?    Current Outpatient Medications  Medication Instructions   benzonatate (TESSALON) 100 mg, Oral, Every 8 hours   fluticasone (FLONASE) 50 MCG/ACT nasal spray 2 sprays, Each Nare, Daily   loratadine (CLARITIN) 10 mg, Oral, Daily PRN   Multiple Vitamin (MULTIVITAMIN WITH MINERALS) TABS tablet 1 tablet, Oral, Daily   sildenafil (REVATIO) 60-80 mg, Oral, At bedtime PRN       Objective:   Physical Exam BP 120/82   Pulse 85   Temp 98.2 F (36.8 C) (Oral)   Resp 16   Ht 6\' 1"  (1.854 m)   Wt 207 lb (93.9 kg)   SpO2 98%   BMI 27.31 kg/m  General:   Well developed, NAD, BMI noted. HEENT:  Normocephalic . Face symmetric, atraumatic. L TM slightly bulged R TM: Bulge, not red. Canals are normal Nose slightly congested Lungs:  CTA B Normal respiratory effort, no intercostal retractions, no accessory muscle use. Heart: RRR,  no murmur.  Lower extremities: no pretibial edema bilaterally  Skin: Not pale. Not jaundice Neurologic:  alert & oriented X3.  Speech normal, gait appropriate for age and unassisted Psych--  Cognition and  judgment appear intact.  Cooperative with normal attention span and concentration.  Behavior appropriate. No anxious or depressed appearing.      Assessment   Assessment  Anxiety, insomnia  ED H/o gonorrhea, h/o chlamydia 4/19   PLAN URI vs allergies: Most likely he has a URI, recommend to continue with fluticasone, add Astepro, if not gradually better start amoxicillin, prescription printed.  See AVS. L abdominal pain: See LOV, he denied any GU or testicular pain at the time.  I ordered a CT abdomen and pelvis, his insurance refused to cover, the recommended ultrasound which is not appropriate. He already has an appointment to see GI for further evaluation.  Pain is episodic.  Not getting worse.

## 2023-04-01 NOTE — Patient Instructions (Addendum)
  For cough:  Take Mucinex DM or Robitussin-DM OTC.  Follow the instructions in the box.   For nasal congestion: -Continue fluticasone: 2 nasal sprays on each side of the nose in the morning until you feel better  -Start  OTC Astepro 2 nasal sprays on each side of the nose twice daily until better  - continue the allergy medicines    Avoid decongestants such as  Pseudoephedrine or phenylephrine     Take the antibiotic as prescribed and if not better in few days.  Call if not gradually better over the next  10 days   Call anytime if the symptoms are severe, you have high fever, short of breath, chest pain

## 2023-04-03 NOTE — Assessment & Plan Note (Signed)
URI vs allergies: Most likely he has a URI, recommend to continue with fluticasone, add Astepro, if not gradually better start amoxicillin, prescription printed.  See AVS. L abdominal pain: See LOV, he denied any GU or testicular pain at the time.  I ordered a CT abdomen and pelvis, his insurance refused to cover, the recommended ultrasound which is not appropriate. He already has an appointment to see GI for further evaluation.  Pain is episodic.  Not getting worse.

## 2023-05-03 ENCOUNTER — Other Ambulatory Visit (HOSPITAL_BASED_OUTPATIENT_CLINIC_OR_DEPARTMENT_OTHER): Payer: Self-pay

## 2023-05-30 ENCOUNTER — Encounter (HOSPITAL_COMMUNITY): Payer: Self-pay

## 2023-05-30 ENCOUNTER — Ambulatory Visit (HOSPITAL_COMMUNITY)
Admission: EM | Admit: 2023-05-30 | Discharge: 2023-05-30 | Disposition: A | Discharge status: Home / Self Care | Payer: Medicaid Other | Attending: Emergency Medicine | Admitting: Emergency Medicine

## 2023-05-30 DIAGNOSIS — Z113 Encounter for screening for infections with a predominantly sexual mode of transmission: Secondary | ICD-10-CM | POA: Diagnosis not present

## 2023-05-30 LAB — POCT URINALYSIS DIP (MANUAL ENTRY)
Bilirubin, UA: NEGATIVE
Blood, UA: NEGATIVE
Glucose, UA: NEGATIVE mg/dL
Ketones, POC UA: NEGATIVE mg/dL
Leukocytes, UA: NEGATIVE
Nitrite, UA: NEGATIVE
Protein Ur, POC: NEGATIVE mg/dL
Spec Grav, UA: 1.02 (ref 1.010–1.025)
Urobilinogen, UA: 0.2 U/dL
pH, UA: 6.5 (ref 5.0–8.0)

## 2023-05-30 NOTE — Discharge Instructions (Addendum)
We will call you if anything on your swab returns positive. You can also see these results on MyChart. Please abstain from sexual intercourse until your results return.

## 2023-05-30 NOTE — ED Provider Notes (Addendum)
[facility]    CSN: [id number] Arrival date & time: 05/30/23  1129      History   Chief Complaint Chief Complaint  Patient presents with   SEXUALLY TRANSMITTED DISEASE    HPI Maurice Hansen is a 42 y.o. male.  About a week of intermittent penile discomfort. Located at the tip. Every now and then has minor discomfort with urination. No discharge. Denies rash or lesions Would like STD testing Recent unprotected intercourse. No known exposure  Past Medical History:  Diagnosis Date   Anxiety    RHINITIS 09/16/2008   STD (male)    h/o gonorrhea     Patient Active Problem List   Diagnosis Date Noted   PCP NOTES >>>>>>>>>>>>>> 12/09/2015   Insomnia 05/10/2014   Anxiety 09/10/2013   Annual physical exam 05/21/2011   TRANSAMINASES, SERUM, ELEVATED 09/23/2009   Allergic rhinitis 09/16/2008    Past Surgical History:  Procedure Laterality Date   FOOT SURGERY     Right foot surgery as a child?       Home Medications    Prior to Admission medications   Medication Sig Start Date End Date Taking? Authorizing Provider  amoxicillin (AMOXIL) 875 MG tablet Take 1 tablet (875 mg total) by mouth 2 (two) times daily. 04/01/23   Wanda Plump, MD  benzonatate (TESSALON) 100 MG capsule Take 1 capsule (100 mg total) by mouth every 8 (eight) hours. 03/27/23   Fannye Myer, Lurena Joiner, PA-C  fluticasone (FLONASE) 50 MCG/ACT nasal spray Place 2 sprays into both nostrils daily. 03/27/23   Kamsiyochukwu Spickler, Lurena Joiner, PA-C  loratadine (CLARITIN) 10 MG tablet Take 1 tablet (10 mg total) by mouth daily as needed for allergies or rhinitis. 10/01/22   Wanda Plump, MD  Multiple Vitamin (MULTIVITAMIN WITH MINERALS) TABS tablet Take 1 tablet by mouth daily.    [provider]  sildenafil (REVATIO) 20 MG tablet Take 3-4 tablets (60-80 mg total) by mouth at bedtime as needed. 08/03/22   Wanda Plump, MD    Family History Family History  Problem Relation Age of Onset   Thyroid disease Mother         hyperthyroid   Breast cancer Mother    Colon cancer Father 14   Stroke Maternal Grandmother        GM and GF   Heart disease Maternal Uncle    Prostate cancer Maternal Uncle 71 - 79   Diabetes Other        GM, GF   Hypertension Other        Gparents    Colon cancer Other        M Great Uncle, M Niece    Social History Social History   Tobacco Use   Smoking status: Former    Current packs/day: 0.00    Types: Cigarettes    Quit date: 03/21/2011    Years since quitting: 12.2   Smokeless tobacco: Never   Tobacco comments:    occ cigar  Substance Use Topics   Alcohol use: Yes    Comment: socially    Drug use: No     Allergies   Patient has no known allergies.   Review of Systems Review of Systems Per HPI  Physical Exam Triage Vital Signs ED Triage Vitals [05/30/23 1305]  Encounter Vitals Group     BP 118/79     Systolic BP Percentile      Diastolic BP Percentile      Pulse Rate 75  Resp 16     Temp 98.1 F (36.7 C)     Temp Source Oral     SpO2 97 %     Weight      Height      Head Circumference      Peak Flow      Pain Score      Pain Loc      Pain Education      Exclude from Growth Chart    No data found.  Updated Vital Signs BP 118/79 (BP Location: Left Arm)   Pulse 75   Temp 98.1 F (36.7 C) (Oral)   Resp 16   SpO2 97%   Visual Acuity Right Eye Distance:   Left Eye Distance:   Bilateral Distance:    Right Eye Near:   Left Eye Near:    Bilateral Near:     Physical Exam Vitals and nursing note reviewed.  Constitutional:      General: He is not in acute distress.    Appearance: Normal appearance.  Cardiovascular:     Rate and Rhythm: Normal rate and regular rhythm.     Heart sounds: Normal heart sounds.  Pulmonary:     Effort: Pulmonary effort is normal.     Breath sounds: Normal breath sounds.  Neurological:     Mental Status: He is alert and oriented to person, place, and time.      UC Treatments / Results  Labs (all  labs ordered are listed, but only abnormal results are displayed) Labs Reviewed  POCT URINALYSIS DIP (MANUAL ENTRY)  CYTOLOGY, (ORAL, ANAL, URETHRAL) ANCILLARY ONLY    EKG   Radiology No results found.  Procedures Procedures (including critical care time)  Medications Ordered in UC Medications - No data to display  Initial Impression / Assessment and Plan / UC Course  I have reviewed the triage vital signs and the nursing notes.  Pertinent labs & imaging results that were available during my care of the patient were reviewed by me and considered in my medical decision making (see chart for details).  UA is unremarkable  Cytology swab pending Treat positive result as indicated Safe sex practices No questions at this time  Final Clinical Impressions(s) / UC Diagnoses   Final diagnoses:  Screen for STD (sexually transmitted disease)     Discharge Instructions      We will call you if anything on your swab returns positive. You can also see these results on MyChart. Please abstain from sexual intercourse until your results return.     ED Prescriptions   None    PDMP not reviewed this encounter.    Alyssamarie Mounsey, Lurena Joiner, PA-C 05/30/23 1340

## 2023-05-30 NOTE — ED Triage Notes (Signed)
Pt presents to the office for penile discomfort x 3 day. Pt does not want to get any blood work today.

## 2023-05-31 LAB — CYTOLOGY, (ORAL, ANAL, URETHRAL) ANCILLARY ONLY
Chlamydia: NEGATIVE
Comment: NEGATIVE
Comment: NEGATIVE
Comment: NORMAL
Neisseria Gonorrhea: NEGATIVE
Trichomonas: NEGATIVE

## 2023-06-02 ENCOUNTER — Encounter: Payer: Self-pay | Admitting: Gastroenterology

## 2023-06-02 ENCOUNTER — Ambulatory Visit: Payer: Medicaid Other | Admitting: Gastroenterology

## 2023-06-02 ENCOUNTER — Other Ambulatory Visit (INDEPENDENT_AMBULATORY_CARE_PROVIDER_SITE_OTHER): Payer: Medicaid Other

## 2023-06-02 VITALS — BP 122/80 | HR 80 | Ht 74.0 in | Wt 227.2 lb

## 2023-06-02 DIAGNOSIS — R1032 Left lower quadrant pain: Secondary | ICD-10-CM

## 2023-06-02 DIAGNOSIS — R1033 Periumbilical pain: Secondary | ICD-10-CM

## 2023-06-02 LAB — BASIC METABOLIC PANEL
BUN: 14 mg/dL (ref 6–23)
CO2: 31 meq/L (ref 19–32)
Calcium: 9.3 mg/dL (ref 8.4–10.5)
Chloride: 105 meq/L (ref 96–112)
Creatinine, Ser: 1.15 mg/dL (ref 0.40–1.50)
GFR: 78.03 mL/min (ref >60.00)
Glucose, Bld: 70 mg/dL (ref 70–99)
Potassium: 4.2 meq/L (ref 3.5–5.1)
Sodium: 140 meq/L (ref 135–145)

## 2023-06-02 NOTE — Patient Instructions (Signed)
You have been scheduled for a CT scan of the abdomen and pelvis at [facility], 1st floor Radiology. You are scheduled on Friday 06/10/23 at 2:30 pm. Please arrive at 12:15 pm for your appointment for registration and to drink oral contrast.  Please follow the written instructions below on the day of your exam:   1) Do not eat anything after 10:30 am (4 hours prior to your test)   You may take any medications as prescribed with a small amount of water, if necessary. If you take any of the following medications: METFORMIN, GLUCOPHAGE, GLUCOVANCE, AVANDAMET, RIOMET, FORTAMET, ACTOPLUS MET, JANUMET, GLUMETZA or METAGLIP, you MAY be asked to HOLD this medication 48 hours AFTER the exam.   The purpose of you drinking the oral contrast is to aid in the visualization of your intestinal tract. The contrast solution may cause some diarrhea. Depending on your individual set of symptoms, you may also receive an intravenous injection of x-ray contrast/dye. Plan on being at [facility] for 45 minutes or longer, depending on the type of exam you are having performed.   If you have any questions regarding your exam or if you need to reschedule, you may call [facility] at [phone] between the hours of 8:00 am and 5:00 pm, Monday-Friday.

## 2023-06-02 NOTE — Progress Notes (Signed)
06/02/2023 TRADEN ARNZEN [id number] 01/11/1981   HISTORY OF PRESENT ILLNESS: This is a 42 year old male who is new to our office.  He has been referred here by his PCP, Dr. Drue Novel, for evaluation of left lower quadrant abdominal pain.  Where he indicates that his pain is more left mid abdomen just to the left of the umbilicus in a large area.  He says that this started back in the spring.  It was somewhat persistent for a while, but now has become intermittent.  There are times that he does not have any pain.  He actually says he describes it more as a discomfort.  Sometimes feels burning.  Says it is often followed by a lot of gas/flatulence.  Says that he moves his bowels regularly.  No blood in his stool.  Had some issues with indigestion in the past, but nothing lately and nothing that he ever took medication for.  Does not use NSAIDs.  Has not really noticed any other aggravating factors/triggers.  Past Medical History:  Diagnosis Date   Anxiety    RHINITIS 09/16/2008   STD (male)    h/o gonorrhea    Past Surgical History:  Procedure Laterality Date   FOOT SURGERY     Right foot surgery as a child?    reports that he quit smoking about 12 years ago. His smoking use included cigarettes. He has never used smokeless tobacco. He reports current alcohol use. He reports that he does not use drugs. family history includes Breast cancer in his mother; Colon cancer in an other family member; Colon cancer (age of onset: 3) in his father; Diabetes in an other family member; Heart disease in his maternal uncle; Hypertension in an other family member; Prostate cancer (age of onset: 80 - 67) in his maternal uncle; Stroke in his maternal grandmother; Thyroid disease in his mother. No Known Allergies    Outpatient Encounter Medications as of 06/02/2023  Medication Sig   sildenafil (REVATIO) 20 MG tablet Take 3-4 tablets (60-80 mg total) by mouth at bedtime as needed.   No facility-administered  encounter medications on file as of 06/02/2023.    REVIEW OF SYSTEMS  : All other systems reviewed and negative except where noted in the History of Present Illness.   PHYSICAL EXAM: BP 122/80   Pulse 80   Ht 6\' 2"  (1.88 m)   Wt 227 lb 3.2 oz (103.1 kg)   BMI 29.17 kg/m  General: Well developed male in no acute distress Head: Normocephalic and atraumatic Eyes:  Sclerae anicteric, conjunctiva pink. Ears: Normal auditory acuity Lungs: Clear throughout to auscultation; no W/R/R. Heart: Regular rate and rhythm; no M/R/G. Abdomen: Soft, non-distended.  BS present.  Mild left sided TTP. Musculoskeletal: Symmetrical with no gross deformities  Skin: No lesions on visible extremities Extremities: No edema  Neurological: Alert oriented x 4, grossly non-focal Psychological:  Alert and cooperative. Normal mood and affect  ASSESSMENT AND PLAN: *Left-sided abdominal pain, really left mid abdominal area.  Was persistent for several months, now more intermittent.  Reports a lot of gas/flatulence with it.  Really no other associated symptoms.  I think the best way to evaluate this with the location of his pain would be a CT scan of the abdomen and pelvis with contrast. *Family history of colon cancer in his father, but was diagnosed in his late 20s.  Can consider colonoscopy in the near future as well, patient will be 44 in a couple of  years.  Will await results of CT scan first.  CC:  Wanda Plump, MD

## 2023-06-10 ENCOUNTER — Ambulatory Visit (HOSPITAL_COMMUNITY)
Admission: RE | Admit: 2023-06-10 | Discharge: 2023-06-10 | Disposition: A | Discharge status: Home / Self Care | Payer: Medicaid Other | Source: Ambulatory Visit | Attending: Gastroenterology | Admitting: Gastroenterology

## 2023-06-10 DIAGNOSIS — R1033 Periumbilical pain: Secondary | ICD-10-CM | POA: Diagnosis not present

## 2023-06-10 DIAGNOSIS — R1032 Left lower quadrant pain: Secondary | ICD-10-CM | POA: Diagnosis not present

## 2023-06-10 DIAGNOSIS — N3289 Other specified disorders of bladder: Secondary | ICD-10-CM | POA: Diagnosis not present

## 2023-06-10 MED ORDER — IOHEXOL 9 MG/ML PO SOLN
1000.0000 mL | ORAL | Status: AC
  Administered 2023-06-10: 1000 mL via ORAL

## 2023-06-10 MED ORDER — IOHEXOL 300 MG/ML  SOLN: 30.0000 mL | Freq: Once | INTRAMUSCULAR | Status: DC | PRN

## 2023-06-10 MED ORDER — IOHEXOL 300 MG/ML  SOLN
100.0000 mL | Freq: Once | INTRAMUSCULAR | Status: AC | PRN
  Administered 2023-06-10: 100 mL via INTRAVENOUS

## 2023-06-20 ENCOUNTER — Telehealth: Payer: Self-pay | Admitting: Internal Medicine

## 2023-06-20 NOTE — Telephone Encounter (Signed)
Patient was advised to follow up with his PCP about his imaging results. Scheduled appt with PCP for 06/24/23. Pt would prefer this to be done over the phone if possible. Please call and advise if appt is not needed.

## 2023-06-21 NOTE — Telephone Encounter (Signed)
Recommend to keep the appointment with me

## 2023-06-21 NOTE — Telephone Encounter (Signed)
Pt already scheduled 06/24/23.

## 2023-06-24 ENCOUNTER — Encounter: Payer: Self-pay | Admitting: Internal Medicine

## 2023-06-24 ENCOUNTER — Ambulatory Visit: Payer: Medicaid Other | Admitting: Internal Medicine

## 2023-06-24 VITALS — BP 118/80 | HR 79 | Temp 98.3°F | Resp 16 | Ht 74.0 in | Wt 227.4 lb

## 2023-06-24 DIAGNOSIS — J9811 Atelectasis: Secondary | ICD-10-CM

## 2023-06-24 DIAGNOSIS — R339 Retention of urine, unspecified: Secondary | ICD-10-CM | POA: Diagnosis not present

## 2023-06-24 LAB — URINALYSIS, ROUTINE W REFLEX MICROSCOPIC
Bilirubin Urine: NEGATIVE
Hgb urine dipstick: NEGATIVE
Ketones, ur: NEGATIVE
Leukocytes,Ua: NEGATIVE
Nitrite: NEGATIVE
Specific Gravity, Urine: 1.015 (ref 1.000–1.030)
Total Protein, Urine: NEGATIVE
Urine Glucose: NEGATIVE
Urobilinogen, UA: 0.2 (ref 0.0–1.0)
pH: 7.5 (ref 5.0–8.0)

## 2023-06-24 NOTE — Progress Notes (Unsigned)
   Subjective:    Patient ID: Maurice Hansen, male    DOB: 14-Jan-1981, 42 y.o.   MRN: [id number]  DOS:  06/24/2023 Type of visit - description: to discuss CT results  On 05/30/2023 went to urgent care with some dysuria.  No fever or chills, no gross hematuria, no penile discharge.  No rash. Soon after the visit, he become asymptomatic. Urine test for gonorrhea and chlamydia were negative  Subsequently had a CT abdomen and pelvis to evaluate a ill-defined left-sided abdominal pain. It shows some constipation, normal prostate, urinary bladder was distended.  At this point, he denies dysuria, gross hematuria.  Sometimes has a "fluttering feeling" at the suprapubic area after voiding.    Review of Systems See above   Past Medical History:  Diagnosis Date   Anxiety    RHINITIS 09/16/2008   STD (male)    h/o gonorrhea     Past Surgical History:  Procedure Laterality Date   FOOT SURGERY     Right foot surgery as a child?    Current Outpatient Medications  Medication Instructions   polyethylene glycol (MIRALAX / GLYCOLAX) 17 g, Oral, Daily   sildenafil (REVATIO) 60-80 mg, Oral, At bedtime PRN       Objective:   Physical Exam BP 118/80   Pulse 79   Temp 98.3 F (36.8 C) (Oral)   Resp 16   Ht 6\' 2"  (1.88 m)   Wt 227 lb 6 oz (103.1 kg)   SpO2 98%   BMI 29.19 kg/m  General:   Well developed, NAD, BMI noted.  HEENT:  Normocephalic . Face symmetric, atraumatic Abdomen:  Not distended, soft, non-tender. No rebound or rigidity. DRE: Normal sphincter tone, prostate normal, no stools. Skin: Not pale. Not jaundice Lower extremities: no pretibial edema bilaterally  Neurologic:  alert & oriented X3.  Speech normal, gait appropriate for age and unassisted Psych--  Cognition and judgment appear intact.  Cooperative with normal attention span and concentration.  Behavior appropriate. No anxious or depressed appearing.     Assessment     Assessment  Anxiety, insomnia   ED H/o gonorrhea, h/o chlamydia 4/19   PLAN Urinary retention?. Had a recent CT for evaluation of a ill-defined left-sided abdominal pain, it showed a question of urinary bladder distention, urinary retention?  Review of system is essentially negative.  See history. Plan: UA, urine culture, postvoid ultrasound, will arrange. Atelectasis versus scarring: also noted on CT, has no pulmonary symptoms, unlikely to be something clinically significant.  Observation.

## 2023-06-24 NOTE — Patient Instructions (Addendum)
Vaccines I recommend: Flu shot Tdap (tetanus) Covid booster  Provide a urine sample  Stop by the first floor, schedule a postvoid ultrasound

## 2023-06-25 ENCOUNTER — Ambulatory Visit (HOSPITAL_BASED_OUTPATIENT_CLINIC_OR_DEPARTMENT_OTHER)
Admission: RE | Admit: 2023-06-25 | Discharge: 2023-06-25 | Disposition: A | Discharge status: Home / Self Care | Payer: Medicaid Other | Source: Ambulatory Visit | Attending: Internal Medicine | Admitting: Internal Medicine

## 2023-06-25 DIAGNOSIS — R339 Retention of urine, unspecified: Secondary | ICD-10-CM | POA: Diagnosis not present

## 2023-06-25 LAB — URINE CULTURE
MICRO NUMBER:: 15818919
Result:: NO GROWTH
SPECIMEN QUALITY:: ADEQUATE

## 2023-06-25 NOTE — Assessment & Plan Note (Signed)
Urinary retention?. Had a recent CT for evaluation of a ill-defined left-sided abdominal pain, it showed a question of urinary bladder distention, urinary retention?  Review of system is essentially negative.  See history. Plan: UA, urine culture, postvoid ultrasound, will arrange. Atelectasis versus scarring: also noted on CT, has no pulmonary symptoms, unlikely to be something clinically significant.  Observation.

## 2023-07-04 ENCOUNTER — Other Ambulatory Visit: Payer: Self-pay | Admitting: Internal Medicine

## 2023-07-04 ENCOUNTER — Other Ambulatory Visit (HOSPITAL_BASED_OUTPATIENT_CLINIC_OR_DEPARTMENT_OTHER): Payer: Self-pay

## 2023-07-05 ENCOUNTER — Other Ambulatory Visit (HOSPITAL_BASED_OUTPATIENT_CLINIC_OR_DEPARTMENT_OTHER): Payer: Self-pay

## 2023-07-05 NOTE — Telephone Encounter (Signed)
Pt called stating that he was looking to switch back to the generic medication for Cialis. Advised pt that an appt would be required for meds management. Pt advised that he has been on this medication before and asked if a message could be sent back to see if it could just be switched. Advised a message would be sent back to look into this.

## 2023-07-06 NOTE — Telephone Encounter (Signed)
Patient is calling back in regarding the message from yesterday regarding his medication he would like a call back about this medication today if possible

## 2023-07-07 ENCOUNTER — Other Ambulatory Visit: Payer: Self-pay

## 2023-07-07 ENCOUNTER — Other Ambulatory Visit: Payer: Self-pay | Admitting: Internal Medicine

## 2023-07-07 ENCOUNTER — Other Ambulatory Visit (HOSPITAL_BASED_OUTPATIENT_CLINIC_OR_DEPARTMENT_OTHER): Payer: Self-pay

## 2023-07-07 MED ORDER — SILDENAFIL CITRATE 20 MG PO TABS
60.0000 mg | ORAL_TABLET | Freq: Every evening | ORAL | 0 refills | Status: AC | PRN
  Filled 2023-07-07: qty 90, 22d supply, fill #0

## 2023-10-07 ENCOUNTER — Telehealth: Payer: Self-pay | Admitting: Internal Medicine

## 2023-10-07 MED ORDER — LORATADINE 10 MG PO TABS: 10.0000 mg | ORAL_TABLET | Freq: Every day | ORAL | 3 refills | Status: AC | PRN

## 2023-10-07 NOTE — Telephone Encounter (Signed)
 Okay to send loratadine 10 mg 1 tablet daily as needed for allergies

## 2023-10-07 NOTE — Telephone Encounter (Signed)
 Rx sent

## 2023-10-07 NOTE — Telephone Encounter (Signed)
 Okay to send loratadine?

## 2023-10-07 NOTE — Telephone Encounter (Signed)
 Copied from CRM [id number]. Topic: Clinical - Medication Question >> Oct 07, 2023 12:32 PM Sim Boast F wrote: Reason for CRM: Patient would like the Loratadine medication (discontinued) sent to [facility] pharmacy on file, he would like a call back with an update

## 2023-10-21 ENCOUNTER — Ambulatory Visit: Payer: Self-pay

## 2023-10-21 MED ORDER — FLUTICASONE PROPIONATE 50 MCG/ACT NA SUSP: 2.0000 | Freq: Every day | NASAL | 5 refills | Status: DC

## 2023-10-21 NOTE — Telephone Encounter (Addendum)
 Rx kept printing, Rx faxed to [facility].

## 2023-10-21 NOTE — Telephone Encounter (Signed)
  2nd attempt, left voicemail with callback number requesting patient call back for nurse triage. Advised that any medication requests can take up to 3 business days to be addressed as well.  Summary: Allergies   Copied From CRM [id number]. Reason for Triage: Patient is stating he has allergies and needs fluticasone (FLONASE) 50 MCG/ACT nasal spray prescribed again.  Callback #: [id number]  Preferred Pharmacy: [id number] [place], [place] - [address] AT [facility] [address] [place] [address] Phone: [phone] Fax: [phone] Hours: Not open 24 hours

## 2023-10-21 NOTE — Telephone Encounter (Signed)
  3rd attempt, left voicemail with callback number requesting patient call back for nurse triage. Advised that any medication requests can take up to 3 business days to be addressed as well.  Routing to clinic.    Summary: Allergies    Copied From CRM [id number]. Reason for Triage: Patient is stating he has allergies and needs fluticasone (FLONASE) 50 MCG/ACT nasal spray prescribed again.  Callback #: [id number]  Preferred Pharmacy: [id number] [place], [place] - [address] AT [facility] & [address] [place] [address] Phone: [phone] Fax: [phone] Hours: Not open 24 hours        Reason for Disposition . Third attempt to contact caller AND no contact made. Phone number verified.  Protocols used: No Contact or Duplicate Contact Call-A-AH

## 2023-10-21 NOTE — Telephone Encounter (Signed)
 This RN made the first attempt to contact the patient. Patient did not answer, RN LVM and a callback number.

## 2023-10-21 NOTE — Addendum Note (Signed)
 Addended byConrad Hallsburg D on: 10/21/2023 03:35 PM   Modules accepted: Orders

## 2023-10-24 ENCOUNTER — Ambulatory Visit: Payer: Self-pay

## 2023-10-24 NOTE — Telephone Encounter (Signed)
 Copied from CRM [id number]. Topic: Clinical - Medication Question >> Oct 24, 2023  1:56 PM Turkey B wrote: Reason for CRM: pt returning cb about his flonase   Chief Complaint: Checking on refill for Flonase. No triage needed. Refill faxed by the practice. Symptoms: n/a Frequency:  Pertinent Negatives: Patient denies  Disposition: [] ED /[] Urgent Care (no appt availability in office) / [] Appointment(In office/virtual)/ []  [facility]/ [x] Home Care/ [] Refused Recommended Disposition /[] [place] [facility]/ []  Follow-up with PCP Additional Notes:   Reason for Disposition  Caller with prescription and triager answers question  Answer Assessment - Initial Assessment Questions 1. DRUG NAME: "What medicine do you need to have refilled?"     Flonase 2. REFILLS REMAINING: "How many refills are remaining?" (Note: The label on the medicine or pill bottle will show how many refills are remaining. If there are no refills remaining, then a renewal may be needed.)     N/a 3. EXPIRATION DATE: "What is the expiration date?" (Note: The label states when the prescription will expire, and thus can no longer be refilled.)     N/a 4. PRESCRIBING HCP: "Who prescribed it?" Reason: If prescribed by specialist, call should be referred to that group.     N/a 5. SYMPTOMS: "Do you have any symptoms?"     allergies 6. PREGNANCY: "Is there any chance that you are pregnant?" "When was your last menstrual period?"     N/a  Protocols used: Medication Refill and Renewal Call-A-AH

## 2023-10-25 ENCOUNTER — Encounter: Payer: Medicaid Other | Admitting: Internal Medicine

## 2024-01-17 ENCOUNTER — Ambulatory Visit: Admitting: Internal Medicine

## 2024-01-17 ENCOUNTER — Other Ambulatory Visit (HOSPITAL_BASED_OUTPATIENT_CLINIC_OR_DEPARTMENT_OTHER): Payer: Self-pay

## 2024-01-17 VITALS — BP 126/84 | HR 71 | Temp 97.8°F | Resp 16 | Ht 74.0 in | Wt 219.5 lb

## 2024-01-17 DIAGNOSIS — Z0000 Encounter for general adult medical examination without abnormal findings: Secondary | ICD-10-CM

## 2024-01-17 DIAGNOSIS — Z23 Encounter for immunization: Secondary | ICD-10-CM | POA: Diagnosis not present

## 2024-01-17 LAB — COMPREHENSIVE METABOLIC PANEL WITH GFR
ALT: 22 U/L (ref 0–53)
AST: 19 U/L (ref 0–37)
Albumin: 4.3 g/dL (ref 3.5–5.2)
Alkaline Phosphatase: 69 U/L (ref 39–117)
BUN: 12 mg/dL (ref 6–23)
CO2: 30 meq/L (ref 19–32)
Calcium: 9.4 mg/dL (ref 8.4–10.5)
Chloride: 104 meq/L (ref 96–112)
Creatinine, Ser: 1.12 mg/dL (ref 0.40–1.50)
GFR: 80.19 mL/min (ref >60.00)
Glucose, Bld: 88 mg/dL (ref 70–99)
Potassium: 4.3 meq/L (ref 3.5–5.1)
Sodium: 140 meq/L (ref 135–145)
Total Bilirubin: 0.7 mg/dL (ref 0.2–1.2)
Total Protein: 7.2 g/dL (ref 6.0–8.3)

## 2024-01-17 LAB — LIPID PANEL
Cholesterol: 206 mg/dL — ABNORMAL HIGH (ref 0–200)
HDL: 38.5 mg/dL — ABNORMAL LOW (ref >39.00)
LDL Cholesterol: 146 mg/dL — ABNORMAL HIGH (ref 0–99)
NonHDL: 167.17
Total CHOL/HDL Ratio: 5
Triglycerides: 105 mg/dL (ref 0.0–149.0)
VLDL: 21 mg/dL (ref 0.0–40.0)

## 2024-01-17 LAB — CBC WITH DIFFERENTIAL/PLATELET
Basophils Absolute: 0 10*3/uL (ref 0.0–0.1)
Basophils Relative: 1.3 % (ref 0.0–3.0)
Eosinophils Absolute: 0.1 10*3/uL (ref 0.0–0.7)
Eosinophils Relative: 4 % (ref 0.0–5.0)
HCT: 42.9 % (ref 39.0–52.0)
Hemoglobin: 14.4 g/dL (ref 13.0–17.0)
Lymphocytes Relative: 48.4 % — ABNORMAL HIGH (ref 12.0–46.0)
Lymphs Abs: 1.7 10*3/uL (ref 0.7–4.0)
MCHC: 33.6 g/dL (ref 30.0–36.0)
MCV: 89.8 fl (ref 78.0–100.0)
Monocytes Absolute: 0.3 10*3/uL (ref 0.1–1.0)
Monocytes Relative: 8.9 % (ref 3.0–12.0)
Neutro Abs: 1.3 10*3/uL — ABNORMAL LOW (ref 1.4–7.7)
Neutrophils Relative %: 37.4 % — ABNORMAL LOW (ref 43.0–77.0)
Platelets: 255 10*3/uL (ref 150.0–400.0)
RBC: 4.77 Mil/uL (ref 4.22–5.81)
RDW: 13.2 % (ref 11.5–15.5)
WBC: 3.4 10*3/uL — ABNORMAL LOW (ref 4.0–10.5)

## 2024-01-17 MED ORDER — FLUTICASONE PROPIONATE 50 MCG/ACT NA SUSP
2.0000 | Freq: Every day | NASAL | 5 refills | Status: AC
  Filled 2024-01-17: qty 16, 30d supply, fill #0

## 2024-01-17 NOTE — Assessment & Plan Note (Signed)
 Here for CPX  Other issues: H/o Anxiety, insomnia: Doing well at this point Urinary retention?  See LOV, UA, urine culture and ultrasound postvoid were all WNL RTC 1 year

## 2024-01-17 NOTE — Assessment & Plan Note (Signed)
 Here for CPX -Td: Today 01/17/2024 -Vaccines are recommended at the pharmacy: COVID booster, flu shot every fall. -CCS (+) FH colon cancer, father, in his 35s.  To start screening age 44 - Prostate cancer screening: Start age 36 -Labs: CMP FLP CBC - pt education: Active at work, encouraged healthier diet.

## 2024-01-17 NOTE — Patient Instructions (Signed)
 Vaccines to consider Flu shot every fall COVID booster   GO TO THE LAB :  Get the blood work   Your results will be posted on MyChart with my comments  Next office visit for a physical exam in 1 year.  Sooner if needed Please make an appointment before you leave today

## 2024-01-17 NOTE — Progress Notes (Signed)
   Subjective:    Patient ID: Maurice Hansen, male    DOB: 13-Feb-1981, 43 y.o.   MRN: [id number]  DOS:  01/17/2024 Type of visit - description: Here for CPX.  Feeling well.  Has no major concerns other than be up-to-date on his screenings  Review of Systems    A 14 point review of systems is negative    Past Medical History:  Diagnosis Date   Anxiety    RHINITIS 09/16/2008   STD (male)    h/o gonorrhea     Past Surgical History:  Procedure Laterality Date   FOOT SURGERY     Right foot surgery as a child?   Social History   Social History Narrative   Originally from [place] .    Moved to [place] aprox 2006    Lives by himself, child 2014     Current Outpatient Medications  Medication Instructions   fluticasone  (FLONASE ) 50 MCG/ACT nasal spray 2 sprays, Each Nare, Daily   loratadine  (CLARITIN ) 10 mg, Oral, Daily PRN   sildenafil  (REVATIO ) 60-80 mg, Oral, At bedtime PRN       Objective:   Physical Exam BP 126/84   Pulse 71   Temp 97.8 F (36.6 C) (Oral)   Resp 16   Ht 6' 2 (1.88 m)   Wt 219 lb 8 oz (99.6 kg)   SpO2 97%   BMI 28.18 kg/m  General: Well developed, NAD, BMI noted Neck: No  thyromegaly  HEENT:  Normocephalic . Face symmetric, atraumatic Lungs:  CTA B Normal respiratory effort, no intercostal retractions, no accessory muscle use. Heart: RRR,  no murmur.  Abdomen:  Not distended, soft, non-tender. No rebound or rigidity.   Lower extremities: no pretibial edema bilaterally  Skin: Exposed areas without rash. Not pale. Not jaundice Neurologic:  alert & oriented X3.  Speech normal, gait appropriate for age and unassisted Strength symmetric and appropriate for age.  Psych: Cognition and judgment appear intact.  Cooperative with normal attention span and concentration.  Behavior appropriate. No anxious or depressed appearing.     Assessment    Assessment  Anxiety, insomnia  ED H/o gonorrhea, h/o chlamydia 4/19   PLAN Here for CPX -Td:  Today 01/17/2024 -Vaccines are recommended at the pharmacy: COVID booster, flu shot every fall. -CCS (+) FH colon cancer, father, in his 101s.  To start screening age 22 - Prostate cancer screening: Start age 41 -Labs: CMP FLP CBC - pt education: Active at work, encouraged healthier diet.  Other issues: H/o Anxiety, insomnia: Doing well at this point Urinary retention?  See LOV, UA, urine culture and ultrasound postvoid were all WNL RTC 1 year

## 2024-01-18 ENCOUNTER — Ambulatory Visit: Payer: Self-pay | Admitting: Internal Medicine

## 2025-01-21 ENCOUNTER — Encounter: Admitting: Internal Medicine
